# Patient Record
Sex: Female | Born: 1945 | Race: Black or African American | Hispanic: No | Marital: Married | Smoking: Never smoker
Health system: Southern US, Community
[De-identification: ages and names within clinical notes are randomized; demographics above are authoritative.]

## PROBLEM LIST (undated history)

## (undated) ENCOUNTER — Emergency Department (HOSPITAL_COMMUNITY): Disposition: A | Discharge status: Home / Self Care | Payer: Medicare Other

## (undated) DIAGNOSIS — I10 Essential (primary) hypertension: Secondary | ICD-10-CM

## (undated) DIAGNOSIS — G309 Alzheimer's disease, unspecified: Secondary | ICD-10-CM

## (undated) DIAGNOSIS — E119 Type 2 diabetes mellitus without complications: Secondary | ICD-10-CM

## (undated) DIAGNOSIS — N289 Disorder of kidney and ureter, unspecified: Secondary | ICD-10-CM

## (undated) DIAGNOSIS — F0390 Unspecified dementia without behavioral disturbance: Secondary | ICD-10-CM

## (undated) DIAGNOSIS — F0280 Dementia in other diseases classified elsewhere without behavioral disturbance: Secondary | ICD-10-CM

---

## 1998-06-07 ENCOUNTER — Other Ambulatory Visit: Admission: RE | Admit: 1998-06-07 | Discharge: 1998-06-07 | Payer: Self-pay | Admitting: Obstetrics and Gynecology

## 1998-09-17 ENCOUNTER — Other Ambulatory Visit: Admission: RE | Admit: 1998-09-17 | Discharge: 1998-09-17 | Payer: Self-pay | Admitting: Obstetrics and Gynecology

## 1999-02-11 ENCOUNTER — Other Ambulatory Visit: Admission: RE | Admit: 1999-02-11 | Discharge: 1999-02-11 | Payer: Self-pay | Admitting: Radiology

## 1999-10-25 ENCOUNTER — Encounter: Payer: Self-pay | Admitting: Specialist

## 1999-10-25 ENCOUNTER — Encounter: Admission: RE | Admit: 1999-10-25 | Discharge: 1999-10-25 | Payer: Self-pay | Admitting: Specialist

## 1999-10-31 ENCOUNTER — Other Ambulatory Visit: Admission: RE | Admit: 1999-10-31 | Discharge: 1999-10-31 | Payer: Self-pay | Admitting: Obstetrics and Gynecology

## 2000-12-08 ENCOUNTER — Other Ambulatory Visit: Admission: RE | Admit: 2000-12-08 | Discharge: 2000-12-08 | Payer: Self-pay | Admitting: Obstetrics and Gynecology

## 2002-04-19 ENCOUNTER — Other Ambulatory Visit: Admission: RE | Admit: 2002-04-19 | Discharge: 2002-04-19 | Payer: Self-pay | Admitting: Gynecology

## 2004-04-30 ENCOUNTER — Other Ambulatory Visit: Admission: RE | Admit: 2004-04-30 | Discharge: 2004-04-30 | Payer: Self-pay | Admitting: Gynecology

## 2006-06-22 ENCOUNTER — Other Ambulatory Visit: Admission: RE | Admit: 2006-06-22 | Discharge: 2006-06-22 | Payer: Self-pay | Admitting: Gynecology

## 2007-09-16 ENCOUNTER — Ambulatory Visit: Payer: Self-pay | Admitting: Internal Medicine

## 2007-10-20 ENCOUNTER — Ambulatory Visit: Payer: Self-pay | Admitting: Internal Medicine

## 2008-01-28 DIAGNOSIS — K648 Other hemorrhoids: Secondary | ICD-10-CM | POA: Insufficient documentation

## 2008-01-28 DIAGNOSIS — K921 Melena: Secondary | ICD-10-CM | POA: Insufficient documentation

## 2008-01-28 DIAGNOSIS — I10 Essential (primary) hypertension: Secondary | ICD-10-CM | POA: Insufficient documentation

## 2008-01-28 DIAGNOSIS — K644 Residual hemorrhoidal skin tags: Secondary | ICD-10-CM | POA: Insufficient documentation

## 2008-01-28 DIAGNOSIS — M1990 Unspecified osteoarthritis, unspecified site: Secondary | ICD-10-CM | POA: Insufficient documentation

## 2008-01-28 DIAGNOSIS — K5909 Other constipation: Secondary | ICD-10-CM

## 2011-04-15 NOTE — Assessment & Plan Note (Signed)
[facility]                         GASTROENTEROLOGY OFFICE NOTE   NAME:Chelsea Robinson, Chelsea Robinson                        MRN:          [id number]  DATE:09/16/2007                            DOB:          07/11/1946    REFERRING PHYSICIAN:  Horton Chin, RN, [facility]   CHIEF COMPLAINT:  Positive Hemasure test.   ASSESSMENT:  Positive Hemosure test in a woman who had a colonoscopy by  me with external hemorrhoids in February 2004.  She is otherwise  asymptomatic at this time except for chronic constipation.  She used to  be helped by stool softeners.  She seems to have forgotten them and  gotten off track with that.  However, stool softeners and/or fiber  always helped.  There are really no changes.  There is some occasional  heartburn.   RECOMMENDATIONS:  Schedule colonoscopy, further plans pending that.   See my medical history form for full details of the history.   PAST MEDICAL HISTORY:  1. External hemorrhoids with colonoscopy, 2004.  2. Hypertension.  3. Diabetes mellitus.  4. Back surgery 15 years ago.  5. Prior partial hysterectomy.   FAMILY HISTORY:  See medical history form for full details.  Pertinent  for alcoholism and liver disease in her brother.   SOCIAL HISTORY:  See medical history form for full details.  She is a  retired Geophysicist/field seismologist.  No alcohol, tobacco, or drugs.   REVIEW OF SYSTEMS:  See medical history form for full details.   PHYSICAL EXAMINATION:  GENERAL:  Alert and oriented  x3.  VITAL SIGNS:  Height 5 feet 3 inches, weight 164, pulse 80 and regular.  See medical history form for full details otherwise.   I have reviewed the office note sent by Pamelia Hoit.   I appreciate the opportunity to care for this patient.    Iva Boop, MD,FACG  Electronically Signed   CEG/MedQ  DD: 09/16/2007  DT: 09/17/2007  Job #: [id number]

## 2012-09-10 DIAGNOSIS — E119 Type 2 diabetes mellitus without complications: Secondary | ICD-10-CM

## 2015-05-02 ENCOUNTER — Encounter (HOSPITAL_COMMUNITY): Payer: Self-pay | Admitting: Emergency Medicine

## 2015-05-02 ENCOUNTER — Inpatient Hospital Stay (HOSPITAL_COMMUNITY)
Admission: EM | Admit: 2015-05-02 | Discharge: 2015-05-04 | DRG: 682 | Disposition: A | Discharge status: Home / Self Care | Payer: Medicare Other | Attending: Internal Medicine | Admitting: Internal Medicine

## 2015-05-02 ENCOUNTER — Emergency Department (HOSPITAL_COMMUNITY): Payer: Medicare Other

## 2015-05-02 DIAGNOSIS — F0390 Unspecified dementia without behavioral disturbance: Secondary | ICD-10-CM | POA: Diagnosis not present

## 2015-05-02 DIAGNOSIS — I10 Essential (primary) hypertension: Secondary | ICD-10-CM | POA: Diagnosis present

## 2015-05-02 DIAGNOSIS — E876 Hypokalemia: Secondary | ICD-10-CM | POA: Diagnosis present

## 2015-05-02 DIAGNOSIS — E860 Dehydration: Secondary | ICD-10-CM | POA: Diagnosis present

## 2015-05-02 DIAGNOSIS — N170 Acute kidney failure with tubular necrosis: Principal | ICD-10-CM | POA: Diagnosis present

## 2015-05-02 DIAGNOSIS — I959 Hypotension, unspecified: Secondary | ICD-10-CM | POA: Insufficient documentation

## 2015-05-02 DIAGNOSIS — D649 Anemia, unspecified: Secondary | ICD-10-CM | POA: Diagnosis present

## 2015-05-02 DIAGNOSIS — R197 Diarrhea, unspecified: Secondary | ICD-10-CM | POA: Diagnosis not present

## 2015-05-02 DIAGNOSIS — E119 Type 2 diabetes mellitus without complications: Secondary | ICD-10-CM | POA: Diagnosis not present

## 2015-05-02 DIAGNOSIS — N289 Disorder of kidney and ureter, unspecified: Secondary | ICD-10-CM

## 2015-05-02 DIAGNOSIS — I129 Hypertensive chronic kidney disease with stage 1 through stage 4 chronic kidney disease, or unspecified chronic kidney disease: Secondary | ICD-10-CM | POA: Diagnosis present

## 2015-05-02 DIAGNOSIS — Z79899 Other long term (current) drug therapy: Secondary | ICD-10-CM

## 2015-05-02 DIAGNOSIS — G9340 Encephalopathy, unspecified: Secondary | ICD-10-CM | POA: Diagnosis present

## 2015-05-02 DIAGNOSIS — R0902 Hypoxemia: Secondary | ICD-10-CM

## 2015-05-02 DIAGNOSIS — Z7982 Long term (current) use of aspirin: Secondary | ICD-10-CM

## 2015-05-02 DIAGNOSIS — N189 Chronic kidney disease, unspecified: Secondary | ICD-10-CM | POA: Diagnosis present

## 2015-05-02 HISTORY — DX: Essential (primary) hypertension: I10

## 2015-05-02 HISTORY — DX: Disorder of kidney and ureter, unspecified: N28.9

## 2015-05-02 HISTORY — DX: Type 2 diabetes mellitus without complications: E11.9

## 2015-05-02 HISTORY — DX: Unspecified dementia, unspecified severity, without behavioral disturbance, psychotic disturbance, mood disturbance, and anxiety: F03.90

## 2015-05-02 LAB — COMPREHENSIVE METABOLIC PANEL
ALT: 11 U/L — ABNORMAL LOW (ref 14–54)
AST: 20 U/L (ref 15–41)
Albumin: 3.8 g/dL (ref 3.5–5.0)
Alkaline Phosphatase: 44 U/L (ref 38–126)
Anion gap: 14 (ref 5–15)
BUN: 19 mg/dL (ref 6–20)
CALCIUM: 9.5 mg/dL (ref 8.9–10.3)
CHLORIDE: 108 mmol/L (ref 101–111)
CO2: 19 mmol/L — AB (ref 22–32)
Creatinine, Ser: 1.45 mg/dL — ABNORMAL HIGH (ref 0.44–1.00)
GFR calc Af Amer: 41 mL/min — ABNORMAL LOW (ref >60)
GFR, EST NON AFRICAN AMERICAN: 36 mL/min — AB (ref >60)
GLUCOSE: 168 mg/dL — AB (ref 65–99)
POTASSIUM: 3.1 mmol/L — AB (ref 3.5–5.1)
Sodium: 141 mmol/L (ref 135–145)
Total Bilirubin: 0.8 mg/dL (ref 0.3–1.2)
Total Protein: 7.1 g/dL (ref 6.5–8.1)

## 2015-05-02 LAB — CBC
HEMATOCRIT: 37.1 % (ref 36.0–46.0)
Hemoglobin: 12 g/dL (ref 12.0–15.0)
MCH: 26.8 pg (ref 26.0–34.0)
MCHC: 32.3 g/dL (ref 30.0–36.0)
MCV: 82.8 fL (ref 78.0–100.0)
Platelets: 212 10*3/uL (ref 150–400)
RBC: 4.48 MIL/uL (ref 3.87–5.11)
RDW: 15 % (ref 11.5–15.5)
WBC: 11.9 10*3/uL — AB (ref 4.0–10.5)

## 2015-05-02 LAB — GLUCOSE, CAPILLARY
Glucose-Capillary: 112 mg/dL — ABNORMAL HIGH (ref 65–99)
Glucose-Capillary: 131 mg/dL — ABNORMAL HIGH (ref 65–99)

## 2015-05-02 LAB — CLOSTRIDIUM DIFFICILE BY PCR: Toxigenic C. Difficile by PCR: NEGATIVE

## 2015-05-02 LAB — URINALYSIS, ROUTINE W REFLEX MICROSCOPIC
Bilirubin Urine: NEGATIVE
Glucose, UA: NEGATIVE mg/dL
KETONES UR: NEGATIVE mg/dL
Leukocytes, UA: NEGATIVE
Nitrite: NEGATIVE
PH: 6 (ref 5.0–8.0)
Protein, ur: NEGATIVE mg/dL
Urobilinogen, UA: 0.2 mg/dL (ref 0.0–1.0)

## 2015-05-02 LAB — TROPONIN I: Troponin I: <0.03 ng/mL (ref <0.031)

## 2015-05-02 LAB — LACTIC ACID, PLASMA: Lactic Acid, Venous: 3.7 mmol/L (ref 0.5–2.0)

## 2015-05-02 LAB — URINE MICROSCOPIC-ADD ON

## 2015-05-02 LAB — MAGNESIUM: MAGNESIUM: 1.6 mg/dL — AB (ref 1.7–2.4)

## 2015-05-02 MED ORDER — SODIUM CHLORIDE 0.9 % IV SOLN
INTRAVENOUS | Status: DC
  Administered 2015-05-02: 19:00:00 via INTRAVENOUS
  Filled 2015-05-02 (×2): qty 1000

## 2015-05-02 MED ORDER — SODIUM CHLORIDE 0.9 % IJ SOLN
3.0000 mL | Freq: Two times a day (BID) | INTRAMUSCULAR | Status: DC
  Administered 2015-05-03 – 2015-05-04 (×2): 3 mL via INTRAVENOUS

## 2015-05-02 MED ORDER — POTASSIUM CHLORIDE CRYS ER 20 MEQ PO TBCR
40.0000 meq | EXTENDED_RELEASE_TABLET | Freq: Once | ORAL | Status: AC
  Administered 2015-05-02: 40 meq via ORAL
  Filled 2015-05-02: qty 2

## 2015-05-02 MED ORDER — INSULIN ASPART 100 UNIT/ML ~~LOC~~ SOLN
0.0000 [IU] | Freq: Three times a day (TID) | SUBCUTANEOUS | Status: DC
  Administered 2015-05-03 (×2): 2 [IU] via SUBCUTANEOUS
  Administered 2015-05-04: 1 [IU] via SUBCUTANEOUS

## 2015-05-02 MED ORDER — SODIUM CHLORIDE 0.9 % IV BOLUS (SEPSIS): 1000.0000 mL | Freq: Once | INTRAVENOUS | Status: DC

## 2015-05-02 MED ORDER — HYDROCODONE-ACETAMINOPHEN 5-325 MG PO TABS: 1.0000 | ORAL_TABLET | ORAL | Status: DC | PRN

## 2015-05-02 MED ORDER — METRONIDAZOLE IN NACL 5-0.79 MG/ML-% IV SOLN
500.0000 mg | Freq: Three times a day (TID) | INTRAVENOUS | Status: DC
  Administered 2015-05-02 – 2015-05-03 (×3): 500 mg via INTRAVENOUS
  Filled 2015-05-02 (×4): qty 100

## 2015-05-02 MED ORDER — SODIUM CHLORIDE 0.9 % IV SOLN: Freq: Once | INTRAVENOUS | Status: DC

## 2015-05-02 MED ORDER — VITAMIN B-12 1000 MCG PO TABS
500.0000 ug | ORAL_TABLET | Freq: Every day | ORAL | Status: DC
  Administered 2015-05-02 – 2015-05-04 (×3): 500 ug via ORAL
  Filled 2015-05-02 (×3): qty 1

## 2015-05-02 MED ORDER — SODIUM CHLORIDE 0.9 % IV SOLN
INTRAVENOUS | Status: DC
  Administered 2015-05-02: 18:00:00 via INTRAVENOUS

## 2015-05-02 MED ORDER — ASPIRIN EC 81 MG PO TBEC
81.0000 mg | DELAYED_RELEASE_TABLET | Freq: Every day | ORAL | Status: DC
  Administered 2015-05-02 – 2015-05-04 (×3): 81 mg via ORAL
  Filled 2015-05-02 (×3): qty 1

## 2015-05-02 MED ORDER — MEMANTINE HCL ER 28 MG PO CP24
28.0000 mg | ORAL_CAPSULE | Freq: Every day | ORAL | Status: DC
  Administered 2015-05-02 – 2015-05-04 (×3): 28 mg via ORAL
  Filled 2015-05-02 (×6): qty 1

## 2015-05-02 MED ORDER — ONDANSETRON HCL 4 MG PO TABS: 4.0000 mg | ORAL_TABLET | Freq: Four times a day (QID) | ORAL | Status: DC | PRN

## 2015-05-02 MED ORDER — ONDANSETRON HCL 4 MG/2ML IJ SOLN: 4.0000 mg | Freq: Four times a day (QID) | INTRAMUSCULAR | Status: DC | PRN

## 2015-05-02 MED ORDER — HEPARIN SODIUM (PORCINE) 5000 UNIT/ML IJ SOLN
5000.0000 [IU] | Freq: Three times a day (TID) | INTRAMUSCULAR | Status: DC
  Administered 2015-05-02 – 2015-05-04 (×6): 5000 [IU] via SUBCUTANEOUS
  Filled 2015-05-02 (×6): qty 1

## 2015-05-02 MED ORDER — INSULIN ASPART 100 UNIT/ML ~~LOC~~ SOLN: 0.0000 [IU] | Freq: Every day | SUBCUTANEOUS | Status: DC

## 2015-05-02 MED ORDER — ACETAMINOPHEN 325 MG PO TABS: 650.0000 mg | ORAL_TABLET | Freq: Four times a day (QID) | ORAL | Status: DC | PRN

## 2015-05-02 MED ORDER — ALUM & MAG HYDROXIDE-SIMETH 200-200-20 MG/5ML PO SUSP: 30.0000 mL | Freq: Four times a day (QID) | ORAL | Status: DC | PRN

## 2015-05-02 MED ORDER — ACETAMINOPHEN 650 MG RE SUPP: 650.0000 mg | Freq: Four times a day (QID) | RECTAL | Status: DC | PRN

## 2015-05-02 MED ORDER — SODIUM CHLORIDE 0.9 % IV BOLUS (SEPSIS): 500.0000 mL | Freq: Once | INTRAVENOUS | Status: DC

## 2015-05-02 NOTE — ED Notes (Signed)
Emptied large amount of watery stool from bedpan.

## 2015-05-02 NOTE — ED Notes (Addendum)
Pt granddaughter, [phone], confirms that pt has had increased confusion and not staying hydrated for last week. Pt granddaughter reports that pt is being seen by kidney specialist for "unknown severity kidney problems."

## 2015-05-02 NOTE — Progress Notes (Signed)
Patient with critical lab lactic acid 3.7 Dr. Kerry HoughMemon notified.

## 2015-05-02 NOTE — H&P (Signed)
[facility] History and Physical  Chelsea Robinson Cecilio ZOX:[id number] DOB: [birth year] DOA: 05/02/2015  Referring physician: Rolland Porter PCP: Delorse Lek, MD   Chief Complaint: ams  HPI: Chelsea Robinson is a 69 y.o. female with a past medical history that includes diabetes, dementia, hypertension, renal disorder presents to the emergency department with the chief complaint of altered mental status. Initial evaluation in the emergency department reveals hypotension, leukocytosis of 11.9 hypokalemia and renal failure. In addition patient had one very large watery stool.  Information obtained from the family who report gradual increased confusion over the last several days but no fever chills nausea vomiting or diarrhea. No reported cough dizziness syncope or near-syncope. No recent falls. Granddaughter does report patient in the process of outpatient workup for "kidney trouble". Agents baseline is not very verbal unable to make wants and needs known incontinent of bladder and bowel which is fairly recent development.  Workup in the emergency department includes chest x-ray and urinalysis that are unremarkable. EKG sinus rhythm, initial troponin negative, lactic acid pending. C. difficile negative. Ova and parasite and GI pathogen PCR pending. His given 1 L of normal saline at the time of my exam her blood pressure is 146/80 she is alert and at her baseline mentality.    Review of Systems:  10 point review of systems completed with family members as patient unable to provide information. Past Medical History  Diagnosis Date  . Diabetes mellitus without complication   . Dementia   . Hypertension   . Renal disorder    History reviewed. No pertinent past surgical history. Social History:  reports that she does not drink alcohol or use illicit drugs. Her tobacco history is not on file. She lives at home with her son and granddaughter she requires 24 7 care she is ambulatory Not on  File  History reviewed. No pertinent family history. family medical history reviewed and noncontributory to the admission of this elderly lady  Prior to Admission medications   Medication Sig Start Date End Date Taking? Authorizing Provider  aspirin 81 MG tablet Take 81 mg by mouth daily.   Yes Historical Provider, MD  cyanocobalamin 500 MCG tablet Take 500 mcg by mouth daily.   Yes Historical Provider, MD  lisinopril-hydrochlorothiazide (PRINZIDE,ZESTORETIC) 20-12.5 MG per tablet Take 1 tablet by mouth daily.  02/23/15  Yes Historical Provider, MD  LORazepam (ATIVAN) 0.5 MG tablet Take 0.5 mg by mouth at bedtime.  04/16/15  Yes Historical Provider, MD  metFORMIN (GLUCOPHAGE) 500 MG tablet Take 500 mg by mouth 2 (two) times daily with a meal.   Yes Historical Provider, MD  NAMENDA XR 28 MG CP24 24 hr capsule Take 28 mg by mouth daily.  04/07/15  Yes Historical Provider, MD   Physical Exam: Filed Vitals:   05/02/15 1445 05/02/15 1450 05/02/15 1500 05/02/15 1515  BP: 152/63 152/63 134/85 143/74  Pulse: 59 70  63  Temp:      TempSrc:      Resp: Height:      Weight:      SpO2: 100% 100%  100%    Wt Readings from Last 3 Encounters:  05/02/15 45.36 kg (100 lb)    General:  Appears calm and comfortable Eyes: PERRL, normal lids, irises & conjunctiva ENT: grossly normal hearing, lips & tongue, weakness membranes of her mouth are slightly dry Neck: no LAD, masses or thyromegaly Cardiovascular: RRR, no Robinson/r/g. No LE edema.  Respiratory: CTA bilaterally, no  w/r/r. Normal respiratory effort. Abdomen: soft, ntnd positive bowel sounds no guarding Skin: no rash or induration seen on limited exam Musculoskeletal: grossly normal tone BUE/BLE Psychiatric: grossly normal mood and affect, speech fluent and appropriate Neurologic: Alert oriented to self only . speech slow attempts to follow commands           Labs on Admission:  Basic Metabolic Panel:  Recent Labs Lab 05/02/15 1140   NA 141  K 3.1*  CL 108  CO2 19*  GLUCOSE 168*  BUN 19  CREATININE 1.45*  CALCIUM 9.5   Liver Function Tests:  Recent Labs Lab 05/02/15 1140  AST 20  ALT 11*  ALKPHOS 44  BILITOT 0.8  PROT 7.1  ALBUMIN 3.8   No results for input(s): LIPASE, AMYLASE in the last 168 hours. No results for input(s): AMMONIA in the last 168 hours. CBC:  Recent Labs Lab 05/02/15 1159  WBC 11.9*  HGB 12.0  HCT 37.1  MCV 82.8  PLT 212   Cardiac Enzymes:  Recent Labs Lab 05/02/15 1140  TROPONINI <0.03    BNP (last 3 results) No results for input(s): BNP in the last 8760 hours.  ProBNP (last 3 results) No results for input(s): PROBNP in the last 8760 hours.  CBG: No results for input(s): GLUCAP in the last 168 hours.  Radiological Exams on Admission: Dg Chest 1 View  05/02/2015   CLINICAL DATA:  Hypoxia. Hypotension. Increase confusion for 1 week.  EXAM: CHEST  1 VIEW  COMPARISON:  04/07/2015  FINDINGS: The heart size and mediastinal contours are within normal limits. Both lungs are clear. Previously described left lower lobe opacity is no longer visualized on this exam. No evidence of pleural effusion or pneumothorax. The visualized skeletal structures are unremarkable.  IMPRESSION: No active disease.   Electronically Signed   By: Myles Rosenthal Robinson.D.   On: 05/02/2015 13:04    EKG: Independently reviewed sinus rhythm  Assessment/Plan Principal Problem:   Acute encephalopathy: Much improved at the time of admission. Likely related to hypotension in the setting of diarrhea and decreased by mouth intake. Much improved after 1 L of normal saline intravenously. Will admit. Will continue gentle IV hydration. No indication of an infectious process. She does have a mild leukocytosis that is probably reactive. Active Problems:    Essential hypertension: Patient hypotensive upon presentation likely related to volume in the setting of diarrhea and decreased by mouth intake. Blood pressure must  improved after IV fluids given. Home medications include lisinopril, hydrochlorothiazide, will hold these. Will monitor blood pressure and resume as indicated.  Acute kidney failure.: Family reports patient under the care of a nephrologist recently due to "kidney issues". Unable to obtain records so her baseline creatinine is unknown. I will request records. In the meantime I will hold any nephrotoxic is gently hydrate with IV fluids as noted above monitor urine output recheck in the morning    Diarrhea: Etiology uncertain. C. difficile negative. Await stool studies. Of note family member does report seeing a wormlike and/or noodle like substance humming out of patient's rectum in the recent past. He is afebrile and nontoxic appearing. Will start Flagyl empirically. Clear liquid diet    Diabetes mellitus without complication: On oral agents. In the hold this for now as her appetite unreliable. Will obtain a hemoglobin A1c. Use sliding scale insulin for optimal control    Dementia: See #1. Patient reports current mentation close to baseline   Code Status: full DVT Prophylaxis: Family Communication:  son at bedside Disposition Plan: home when ready hopefully tomorrow  Time spent: 60 minutes  [facility] Surgical Suites LLCBLACK,Chelsea Robinson [facility] Pager [phone]

## 2015-05-02 NOTE — ED Notes (Signed)
Report given to floor nurse, all questions answered  

## 2015-05-02 NOTE — ED Notes (Signed)
Patient has received a total of 1 Liter NS

## 2015-05-02 NOTE — ED Provider Notes (Signed)
CSN: [id number]     Arrival date & time 05/02/15  0926 History  This chart was scribed for Rolland PorterMark Marla Pouliot, MD by Tanda RockersMargaux Venter, ED Scribe. This patient was seen in room APA03/APA03 and the patient's care was started at 9:59 AM.    Chief Complaint  Patient presents with  . Failure To Thrive   LEVEL 5 CAVEAT  The history is provided by the patient. No language interpreter was used.   HPI Comments: Zelphia CairoMary R Smouse is a 69 y.o. female brought in by ambulance, with hx DM, Dementia, HTN, Renal disorder, who presents to the Emergency Department for failure to thrive. Pt is unsure if she is feeling sick. She states she thinks she has been coughing. Pt admits to feeling confused. Denies pain at this moment.  No family is with pt to give a better history.   Past Medical History  Diagnosis Date  . Diabetes mellitus without complication   . Dementia   . Hypertension   . Renal disorder    History reviewed. No pertinent past surgical history. History reviewed. No pertinent family history. History  Substance Use Topics  . Smoking status: Unknown If Ever Smoked  . Smokeless tobacco: Not on file  . Alcohol Use: No   OB History    No data available     Review of Systems  Unable to perform ROS: Mental status change      Allergies  Review of patient's allergies indicates not on file.  Home Medications   Prior to Admission medications   Medication Sig Start Date End Date Taking? Authorizing Provider  aspirin 81 MG tablet Take 81 mg by mouth daily.   Yes Historical Provider, MD  cyanocobalamin 500 MCG tablet Take 500 mcg by mouth daily.   Yes Historical Provider, MD  lisinopril-hydrochlorothiazide (PRINZIDE,ZESTORETIC) 20-12.5 MG per tablet Take 1 tablet by mouth daily.  02/23/15  Yes Historical Provider, MD  LORazepam (ATIVAN) 0.5 MG tablet Take 0.5 mg by mouth at bedtime.  04/16/15  Yes Historical Provider, MD  metFORMIN (GLUCOPHAGE) 500 MG tablet Take 500 mg by mouth 2 (two) times daily with a  meal.   Yes Historical Provider, MD  NAMENDA XR 28 MG CP24 24 hr capsule Take 28 mg by mouth daily.  04/07/15  Yes Historical Provider, MD   Triage Vitals: BP 92/49 mmHg  Pulse 69  Temp(Src) 97.5 F (36.4 C) (Oral)  Resp 16  Ht 5\' 9"  (1.753 m)  Wt 100 lb (45.36 kg)  BMI 14.76 kg/m2  SpO2 97%  Physical Exam  Constitutional: She is oriented to person, place, and time. She appears well-developed and well-nourished. No distress.  HENT:  Head: Normocephalic.  Mouth/Throat: Mucous membranes are normal.  Eyes: Conjunctivae are normal. Pupils are equal, round, and reactive to light. No scleral icterus.  Neck: Normal range of motion. Neck supple. No thyromegaly present.  Cardiovascular: Normal rate and regular rhythm.  Exam reveals no gallop and no friction rub.   No murmur heard. Pulmonary/Chest: Effort normal. No respiratory distress. She has no wheezes. She has no rales.  Diminished breath sounds.   Abdominal: Soft. Bowel sounds are normal. She exhibits no distension. There is no tenderness. There is no rebound.  Musculoskeletal: Normal range of motion.  Neurological: She is alert and oriented to person, place, and time.  Skin: Skin is warm and dry. No rash noted.  Psychiatric: She has a normal mood and affect. Her behavior is normal.    ED Course  Procedures (including  critical care time)  DIAGNOSTIC STUDIES: Oxygen Saturation is 97% on RA, normal by my interpretation.    COORDINATION OF CARE: 10:02 AM-Discussed treatment plan which includes CBC, BMP with pt at bedside and pt agreed to plan.   Labs Review Labs Reviewed  CBC - Abnormal; Notable for the following:    WBC 11.9 (*)    All other components within normal limits  COMPREHENSIVE METABOLIC PANEL - Abnormal; Notable for the following:    Potassium 3.1 (*)    CO2 19 (*)    Glucose, Bld 168 (*)    Creatinine, Ser 1.45 (*)    ALT 11 (*)    GFR calc non Af Amer 36 (*)    GFR calc Af Amer 41 (*)    All other components  within normal limits  URINALYSIS, ROUTINE W REFLEX MICROSCOPIC (NOT AT [facility]) - Abnormal; Notable for the following:    Specific Gravity, Urine <1.005 (*)    Hgb urine dipstick TRACE (*)    All other components within normal limits  CULTURE, BLOOD (ROUTINE X 2)  CULTURE, BLOOD (ROUTINE X 2)  URINE CULTURE  OVA AND PARASITE EXAMINATION  CLOSTRIDIUM DIFFICILE BY PCR (NOT AT [facility])  TROPONIN I  URINE MICROSCOPIC-ADD ON  LACTIC ACID, PLASMA  LACTIC ACID, PLASMA    Imaging Review Dg Chest 1 View  05/02/2015   CLINICAL DATA:  Hypoxia. Hypotension. Increase confusion for 1 week.  EXAM: CHEST  1 VIEW  COMPARISON:  04/07/2015  FINDINGS: The heart size and mediastinal contours are within normal limits. Both lungs are clear. Previously described left lower lobe opacity is no longer visualized on this exam. No evidence of pleural effusion or pneumothorax. The visualized skeletal structures are unremarkable.  IMPRESSION: No active disease.   Electronically Signed   By: Myles Rosenthal M.D.   On: 05/02/2015 13:04     EKG Interpretation None      MDM   Final diagnoses:  Hypoxia  Hypotension, unspecified hypotension type  Diarrhea    Patient's blood pressure improved after IV fluids. Stool obtained for O&P, as well as C. difficile. Slight elevation of her creatinine. Plan is admission. Discussed with hospitalist.   I personally performed the services described in this documentation, which was scribed in my presence. The recorded information has been reviewed and is accurate.      Rolland Porter, MD 05/02/15 1452

## 2015-05-02 NOTE — ED Notes (Signed)
Per [facility], pt has poor oral intake and increased confusion x1 week. Per EMS, pt CBG 163. Per EMS, hypotension and 88 o2 saturation on room air. Pt received 350 cc of NS en route. BP 85/52. Pt o2 saturation 90% on non-rebreather.pt alert.

## 2015-05-02 NOTE — ED Notes (Signed)
After numerous attempts lab unable to obtain blood for lactic acid

## 2015-05-03 DIAGNOSIS — D649 Anemia, unspecified: Secondary | ICD-10-CM | POA: Diagnosis present

## 2015-05-03 DIAGNOSIS — N170 Acute kidney failure with tubular necrosis: Secondary | ICD-10-CM | POA: Diagnosis present

## 2015-05-03 DIAGNOSIS — Z79899 Other long term (current) drug therapy: Secondary | ICD-10-CM | POA: Diagnosis not present

## 2015-05-03 DIAGNOSIS — R197 Diarrhea, unspecified: Secondary | ICD-10-CM | POA: Diagnosis not present

## 2015-05-03 DIAGNOSIS — E860 Dehydration: Secondary | ICD-10-CM | POA: Diagnosis present

## 2015-05-03 DIAGNOSIS — F0390 Unspecified dementia without behavioral disturbance: Secondary | ICD-10-CM | POA: Diagnosis not present

## 2015-05-03 DIAGNOSIS — E876 Hypokalemia: Secondary | ICD-10-CM | POA: Diagnosis present

## 2015-05-03 DIAGNOSIS — I959 Hypotension, unspecified: Secondary | ICD-10-CM | POA: Diagnosis not present

## 2015-05-03 DIAGNOSIS — I10 Essential (primary) hypertension: Secondary | ICD-10-CM

## 2015-05-03 DIAGNOSIS — E119 Type 2 diabetes mellitus without complications: Secondary | ICD-10-CM | POA: Diagnosis not present

## 2015-05-03 DIAGNOSIS — N189 Chronic kidney disease, unspecified: Secondary | ICD-10-CM | POA: Diagnosis present

## 2015-05-03 DIAGNOSIS — Z7982 Long term (current) use of aspirin: Secondary | ICD-10-CM | POA: Diagnosis not present

## 2015-05-03 DIAGNOSIS — I129 Hypertensive chronic kidney disease with stage 1 through stage 4 chronic kidney disease, or unspecified chronic kidney disease: Secondary | ICD-10-CM | POA: Diagnosis present

## 2015-05-03 DIAGNOSIS — G9340 Encephalopathy, unspecified: Secondary | ICD-10-CM | POA: Diagnosis not present

## 2015-05-03 LAB — OVA AND PARASITE EXAMINATION: Ova and parasites: NONE SEEN

## 2015-05-03 LAB — BASIC METABOLIC PANEL
Anion gap: 8 (ref 5–15)
BUN: 21 mg/dL — AB (ref 6–20)
CALCIUM: 8.8 mg/dL — AB (ref 8.9–10.3)
CHLORIDE: 114 mmol/L — AB (ref 101–111)
CO2: 20 mmol/L — AB (ref 22–32)
Creatinine, Ser: 1.58 mg/dL — ABNORMAL HIGH (ref 0.44–1.00)
GFR calc non Af Amer: 32 mL/min — ABNORMAL LOW (ref >60)
GFR, EST AFRICAN AMERICAN: 37 mL/min — AB (ref >60)
Glucose, Bld: 106 mg/dL — ABNORMAL HIGH (ref 65–99)
Potassium: 4.1 mmol/L (ref 3.5–5.1)
Sodium: 142 mmol/L (ref 135–145)

## 2015-05-03 LAB — URINE CULTURE
CULTURE: NO GROWTH
Colony Count: NO GROWTH

## 2015-05-03 LAB — CBC
HEMATOCRIT: 27.5 % — AB (ref 36.0–46.0)
Hemoglobin: 9.2 g/dL — ABNORMAL LOW (ref 12.0–15.0)
MCH: 27.4 pg (ref 26.0–34.0)
MCHC: 33.5 g/dL (ref 30.0–36.0)
MCV: 81.8 fL (ref 78.0–100.0)
Platelets: 248 10*3/uL (ref 150–400)
RBC: 3.36 MIL/uL — ABNORMAL LOW (ref 3.87–5.11)
RDW: 15 % (ref 11.5–15.5)
WBC: 9.9 10*3/uL (ref 4.0–10.5)

## 2015-05-03 LAB — MAGNESIUM: Magnesium: 1.6 mg/dL — ABNORMAL LOW (ref 1.7–2.4)

## 2015-05-03 LAB — LACTIC ACID, PLASMA: LACTIC ACID, VENOUS: 1.9 mmol/L (ref 0.5–2.0)

## 2015-05-03 LAB — HEMOGLOBIN A1C
Hgb A1c MFr Bld: 8 % — ABNORMAL HIGH (ref 4.8–5.6)
MEAN PLASMA GLUCOSE: 183 mg/dL

## 2015-05-03 LAB — GLUCOSE, CAPILLARY
GLUCOSE-CAPILLARY: 90 mg/dL (ref 65–99)
Glucose-Capillary: 101 mg/dL — ABNORMAL HIGH (ref 65–99)
Glucose-Capillary: 158 mg/dL — ABNORMAL HIGH (ref 65–99)
Glucose-Capillary: 156 mg/dL — ABNORMAL HIGH (ref 65–99)

## 2015-05-03 LAB — TROPONIN I

## 2015-05-03 MED ORDER — SODIUM BICARBONATE 8.4 % IV SOLN
INTRAVENOUS | Status: DC
  Filled 2015-05-03 (×3): qty 50

## 2015-05-03 MED ORDER — LORAZEPAM 1 MG PO TABS
1.0000 mg | ORAL_TABLET | Freq: Once | ORAL | Status: AC
  Administered 2015-05-03: 1 mg via ORAL
  Filled 2015-05-03: qty 1

## 2015-05-03 MED ORDER — MAGNESIUM SULFATE 2 GM/50ML IV SOLN
2.0000 g | INTRAVENOUS | Status: AC
  Administered 2015-05-03 (×2): 2 g via INTRAVENOUS
  Filled 2015-05-03: qty 50

## 2015-05-03 MED ORDER — MAGNESIUM SULFATE 4 GM/100ML IV SOLN: 4.0000 g | Freq: Once | INTRAVENOUS | Status: DC

## 2015-05-03 MED ORDER — SODIUM BICARBONATE 8.4 % IV SOLN
INTRAVENOUS | Status: DC
  Filled 2015-05-03 (×3): qty 150

## 2015-05-03 MED ORDER — POTASSIUM CHLORIDE IN NACL 40-0.9 MEQ/L-% IV SOLN: INTRAVENOUS | Status: DC

## 2015-05-03 MED ORDER — DEXTROSE-NACL 5-0.45 % IV SOLN: INTRAVENOUS | Status: DC

## 2015-05-03 MED ORDER — DEXTROSE-NACL 5-0.45 % IV SOLN
INTRAVENOUS | Status: DC
  Administered 2015-05-03: 12:00:00 via INTRAVENOUS

## 2015-05-03 NOTE — Care Management Note (Signed)
Case Management Note  Patient Details  Name: Chelsea Robinson MRN: [id number] Date of Birth: 08/18/1946   Expected Discharge Date:  05/05/15               Expected Discharge Plan:  Home/Self Care  In-House Referral:  NA  Discharge planning Services  CM Consult  Post Acute Care Choice:  NA Choice offered to:  NA  DME Arranged:    DME Agency:     HH Arranged:    HH Agency:     Status of Service:  Completed, signed off  Medicare Important Message Given:    Date Medicare IM Given:    Medicare IM give by:    Date Additional Medicare IM Given:    Additional Medicare Important Message give by:     If discussed at Long Length of Stay Meetings, dates discussed:    Additional Comments: Pt is from home and is independent at baseline. Pt has advanced dementia, her son lives with her and cares for her at night. 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 Pt has no DME's or HH services prior to admission. Pt's son does not anticipate she will need them. Pt plans to discharge home with self care/24-7 supervision. Will cont to follow but no CM need anticipated.   Malcolm Metrohildress, Mariem Skolnick Demske, RN 05/03/2015, 2:24 PM

## 2015-05-03 NOTE — Evaluation (Deleted)
Physical Therapy Evaluation Patient Details Name: Chelsea CairoMary R Topper MRN: [id number] DOB: 12/27/1945 Today's Date: 05/03/2015   History of Present Illness  Patient was brought to the hospital with worsening of her mental status. She was found to be dehydrated, hypotensive and having frequent diarrhea. She received IV fluids with improvement of her blood pressure and mental status. She does have some renal insufficiency will be given further IV fluids. Stool studies have been sent. Continue to monitor clinically for now.  Clinical Impression   Pt was seen for a partial evaluation.  She presents with significant dementia, not even able to give me her name.  She has 3/5 strength in LEs.  A rectal tube is currently in place and RN came in to place a foley catheter so we suspended evaluation.  Will try to continue later on today.    Follow Up Recommendations  (to be determined)    Equipment Recommendations  None recommended by PT    Recommendations for Other Services   none    Precautions / Restrictions Precautions Precaution Comments: c diff Restrictions Weight Bearing Restrictions: No      Mobility  Bed Mobility               General bed mobility comments: unable to evaluate as RN needed to place a foley catheter.  Transfers                    Ambulation/Gait                Stairs            Wheelchair Mobility    Modified Rankin (Stroke Patients Only)       Balance                                             Pertinent Vitals/Pain Pain Assessment: No/denies pain    Home Living Family/patient expects to be discharged to:: Private residence Living Arrangements: Children Available Help at Discharge: Family             Additional Comments: pt has significant dementia and is unable to provide any history    Prior Function           Comments: unknown     Hand Dominance        Extremity/Trunk Assessment               Lower Extremity Assessment: Generalized weakness (generally 3/5 strength in LEs)         Communication   Communication: No difficulties  Cognition Arousal/Alertness: Lethargic Behavior During Therapy: Flat affect Overall Cognitive Status: History of cognitive impairments - at baseline                      General Comments      Exercises        Assessment/Plan    PT Assessment  (to be determined)  PT Diagnosis  (to be determined)   PT Problem List    PT Treatment Interventions     PT Goals (Current goals can be found in the Care Plan section)      Frequency     Barriers to discharge        Co-evaluation               End of Session   Activity  Tolerance: Patient tolerated treatment well Patient left: in bed;with bed alarm set Nurse Communication: Mobility status    Functional Assessment Tool Used: clinical judgement Functional Limitation:  (to be determined)    Time: 1610-9604 PT Time Calculation (min) (ACUTE ONLY): 18 min   Charges:   PT Evaluation $Initial PT Evaluation Tier I: 1 Procedure     PT G Codes:   PT G-Codes **NOT FOR INPATIENT CLASS** Functional Assessment Tool Used: clinical judgement Functional Limitation:  (to be determined)    Myrlene Broker L 05/03/2015, 11:26 AM

## 2015-05-03 NOTE — Progress Notes (Signed)
[facility] PROGRESS NOTE  Chelsea Robinson ZOX:[id number] DOB: 01-Oct-1946 DOA: 05/02/2015 PCP: Delorse Lek, MD  Assessment/Plan: 1. Acute encephalopathy superimposed on dementia. Likely related to hypotension. Improved with IV fluid administration. Pierce to be back to baseline. 2. Acute kidney injury, may be an element of chronic kidney disease. We'll request labs from her primary care physician. Creatinine has trended higher today. There may be an element of ATN related to hypotension experienced yesterday. 3. Diarrhea. None since admission. Stool C. difficile as well as ova and parasites have been negative. We'll discontinue Flagyl. Advance diet. 4. Diabetes. Continue sliding scale insulin. 5. Anemia. Possibly chronic. No evidence of bleeding. We'll request labs from primary care physician  Code Status: full code Family Communication: discussed with patient and family at the bedside Disposition Plan: discharge home once improved   Consultants:    Procedures:    Antibiotics:  Flagyl 6/1>>6/2  HPI/Subjective: Patient is confused, does not offer any complaints, per family, no diarrhea or vomiting  Objective: Filed Vitals:   05/03/15 1505  BP: 114/64  Pulse: 73  Temp: 97.6 F (36.4 C)  Resp: 16    Intake/Output Summary (Last 24 hours) at 05/03/15 1718 Last data filed at 05/03/15 1507  Gross per 24 hour  Intake   1425 ml  Output    800 ml  Net    625 ml   Filed Weights   05/02/15 0932 05/02/15 1543  Weight: 45.36 kg (100 lb) 52.2 kg (115 lb 1.3 oz)    Exam:   General:  NAD, confused  Cardiovascular: s1, s2, rrr  Respiratory: cta b  Abdomen: soft, nt, nd, bs+  Musculoskeletal: no edema b/l   Data Reviewed: Basic Metabolic Panel:  Recent Labs Lab 05/02/15 1140 05/02/15 1457 05/03/15 0530  NA 141  --  142  K 3.1*  --  4.1  CL 108  --  114*  CO2 19*  --  20*  GLUCOSE 168*  --  106*  BUN 19  --  21*  CREATININE 1.45*  --  1.58*  CALCIUM  9.5  --  8.8*  MG  --  1.6* 1.6*   Liver Function Tests:  Recent Labs Lab 05/02/15 1140  AST 20  ALT 11*  ALKPHOS 44  BILITOT 0.8  PROT 7.1  ALBUMIN 3.8   No results for input(s): LIPASE, AMYLASE in the last 168 hours. No results for input(s): AMMONIA in the last 168 hours. CBC:  Recent Labs Lab 05/02/15 1159 05/03/15 0530  WBC 11.9* 9.9  HGB 12.0 9.2*  HCT 37.1 27.5*  MCV 82.8 81.8  PLT 212 248   Cardiac Enzymes:  Recent Labs Lab 05/02/15 1140 05/02/15 2002 05/03/15 0530  TROPONINI <0.03 <0.03 <0.03   BNP (last 3 results) No results for input(s): BNP in the last 8760 hours.  ProBNP (last 3 results) No results for input(s): PROBNP in the last 8760 hours.  CBG:  Recent Labs Lab 05/02/15 1616 05/02/15 2132 05/03/15 0726 05/03/15 1151 05/03/15 1627  GLUCAP 112* 131* 101* 158* 156*    Recent Results (from the past 240 hour(s))  Ova and parasite examination     Status: None   Collection Time: 05/02/15 11:00 AM  Result Value Ref Range Status   Specimen Description STOOL  Final   Special Requests NONE  Final   Ova and parasites   Final    NO OVA OR PARASITES SEEN Performed at [facility]    Report Status 05/03/2015 FINAL  Final  Clostridium Difficile by PCR     Status: None   Collection Time: 05/02/15 11:00 AM  Result Value Ref Range Status   C difficile by pcr NEGATIVE NEGATIVE Final  Culture, blood (routine x 2)     Status: None (Preliminary result)   Collection Time: 05/02/15 11:10 AM  Result Value Ref Range Status   Specimen Description BLOOD LEFT HAND  Final   Special Requests BOTTLES DRAWN AEROBIC ONLY 4CC  Final   Culture NO GROWTH 1 DAY  Final   Report Status PENDING  Incomplete  Culture, blood (routine x 2)     Status: None (Preliminary result)   Collection Time: 05/02/15 11:59 AM  Result Value Ref Range Status   Specimen Description BLOOD RIGHT HAND  Final   Special Requests BOTTLES DRAWN AEROBIC ONLY 4CC  Final   Culture  NO GROWTH 1 DAY  Final   Report Status PENDING  Incomplete     Studies: Dg Chest 1 View  05/02/2015   CLINICAL DATA:  Hypoxia. Hypotension. Increase confusion for 1 week.  EXAM: CHEST  1 VIEW  COMPARISON:  04/07/2015  FINDINGS: The heart size and mediastinal contours are within normal limits. Both lungs are clear. Previously described left lower lobe opacity is no longer visualized on this exam. No evidence of pleural effusion or pneumothorax. The visualized skeletal structures are unremarkable.  IMPRESSION: No active disease.   Electronically Signed   By: Myles RosenthalJohn  Stahl M.D.   On: 05/02/2015 13:04    Scheduled Meds: . aspirin EC  81 mg Oral Daily  . heparin  5,000 Units Subcutaneous 3 times per day  . insulin aspart  0-5 Units Subcutaneous QHS  . insulin aspart  0-9 Units Subcutaneous TID WC  . memantine  28 mg Oral Daily  . sodium chloride  3 mL Intravenous Q12H  . cyanocobalamin  500 mcg Oral Daily   Continuous Infusions: . dextrose 5 % and 0.45% NaCl 100 mL/hr at 05/03/15 1206    Principal Problem:   Acute encephalopathy Active Problems:   Essential hypertension   Diarrhea   Diabetes mellitus without complication   Dementia   Renal disorder   Hypokalemia    Time spent: 30mins    MEMON,JEHANZEB  [facility] Pager [phone]. If 7PM-7AM, please contact night-coverage at [email or web address], password [email or web address] 05/03/2015, 5:18 PM  LOS: 1 day

## 2015-05-03 NOTE — Evaluation (Addendum)
Physical Therapy Evaluation Patient Details Name: Reni Hausner Garcon MRN: [id number] DOB: 1946-05-29 Today's Date: 05/03/2015   History of Present Illness  Patient was brought to the hospital with worsening of her mental status. She was found to be dehydrated, hypotensive and having frequent diarrhea. She received IV fluids with improvement of her blood pressure and mental status. She does have some renal insufficiency will be given further IV fluids. Stool studies have been sent. Continue to monitor clinically for now.  Clinical Impression  Pt was seen for evaluation which was interrupted for a brief time while RN placed the foley catheter.  She was found to have a severe cognitive deficit due to dementia but fortunately her sister arrived and indicated that pt was able to ambulate independently at home.  Pt lives alone but family rotates in and out to supervise her.  Her strength and balance are WNL and pt is able to ambulate with no assistive device for functional distance.  I do not anticipate that she will have any difficulty returning home at d/c.    Follow Up Recommendations No PT follow up    Equipment Recommendations  None recommended by PT    Recommendations for Other Services   none    Precautions / Restrictions Precautions Precaution Comments: c diff Restrictions Weight Bearing Restrictions: No      Mobility  Bed Mobility Overal bed mobility: Modified Independent             General bed mobility comments: unable to evaluate as RN needed to place a foley catheter.  Transfers Overall transfer level: Modified independent                  Ambulation/Gait Ambulation/Gait assistance: Supervision Ambulation Distance (Feet): 50 Feet Assistive device: None Gait Pattern/deviations: WFL(Within Functional Limits)   Gait velocity interpretation: at or above normal speed for age/gender                      Balance Overall balance assessment: No apparent  balance deficits (not formally assessed)                                           Pertinent Vitals/Pain Pain Assessment: No/denies pain    Home Living Family/patient expects to be discharged to:: Private residence Living Arrangements: Alone Available Help at Discharge: Available 24 hours/day Type of Home: House       Home Layout: One level   Additional Comments: pt has significant dementia and is unable to provide any history    Prior Function Level of Independence: Needs assistance   Gait / Transfers Assistance Needed: ambulates and transfers independently  ADL's / Homemaking Assistance Needed: assist with all ADLs due to cognitive impairment  Comments: unknown             Extremity/Trunk Assessment   Upper Extremity Assessment: Overall WFL for tasks assessed           Lower Extremity Assessment: Overall WFL for tasks assessed         Communication   Communication: No difficulties  Cognition Arousal/Alertness: Awake/alert Behavior During Therapy: Flat affect Overall Cognitive Status: History of cognitive impairments - at baseline  Assessment/Plan    PT Assessment Patent does not need any further PT services  PT Diagnosis  (to be determined)   PT Problem List    PT Treatment Interventions     PT Goals (Current goals can be found in the Care Plan section) Acute Rehab PT Goals PT Goal Formulation: All assessment and education complete, DC therapy         Barriers to discharge    none                   End of Session Equipment Utilized During Treatment: Gait belt Activity Tolerance: Patient tolerated treatment well Patient left: in chair;with call bell/phone within reach;with chair alarm set Nurse Communication: Mobility status    Functional Assessment Tool Used: clinical judgement Functional Limitation: Mobility: Walking and moving around Mobility: Walking and  Moving Around Current Status [phone](G8978): 0 percent impaired, limited or restricted Mobility: Walking and Moving Around Goal Status [phone](G8979): 0 percent impaired, limited or restricted Mobility: Walking and Moving Around Discharge Status [phone](G8980): 0 percent impaired, limited or restricted    Time: 1047 (returned at 11:38 and left at 11:58 to complete the eval)-1105 PT Time Calculation (min) (ACUTE ONLY): 18 min   Charges:   PT Evaluation $Initial PT Evaluation Tier I: 1 Procedure     PT G Codes:   PT G-Codes **NOT FOR INPATIENT CLASS** Functional Assessment Tool Used: clinical judgement Functional Limitation: Mobility: Walking and moving around Mobility: Walking and Moving Around Current Status ([phone](G8978): 0 percent impaired, limited or restricted Mobility: Walking and Moving Around Goal Status ([id number](G8979): 0 percent impaired, limited or restricted Mobility: Walking and Moving Around Discharge Status ([phone](G8980): 0 percent impaired, limited or restricted    Myrlene BrokerBrown, Shantee Hayne L  PT 05/03/2015, 12:40 PM

## 2015-05-03 NOTE — Progress Notes (Signed)
Patient pulled IV out. Attempted x2 to restick with the help of her son and other staff. Patient becomes combative and will not allow IV to be accessed. Dr. Kerry HoughMemon notified.

## 2015-05-03 NOTE — Progress Notes (Signed)
New order for Ativan 1mg  PO once for anxiety due to need of IV access.

## 2015-05-03 NOTE — Progress Notes (Signed)
Patient has not voided since admission, Vaginal bleeding also noted. Toya SmothersKaren Black, NP notified. New order for Foley catheter and repeat lactic acid.

## 2015-05-04 DIAGNOSIS — I959 Hypotension, unspecified: Secondary | ICD-10-CM | POA: Insufficient documentation

## 2015-05-04 LAB — BASIC METABOLIC PANEL
ANION GAP: 7 (ref 5–15)
BUN: 15 mg/dL (ref 6–20)
CO2: 22 mmol/L (ref 22–32)
Calcium: 9 mg/dL (ref 8.9–10.3)
Chloride: 114 mmol/L — ABNORMAL HIGH (ref 101–111)
Creatinine, Ser: 1.13 mg/dL — ABNORMAL HIGH (ref 0.44–1.00)
GFR, EST AFRICAN AMERICAN: 56 mL/min — AB (ref >60)
GFR, EST NON AFRICAN AMERICAN: 48 mL/min — AB (ref >60)
Glucose, Bld: 108 mg/dL — ABNORMAL HIGH (ref 65–99)
Potassium: 3.7 mmol/L (ref 3.5–5.1)
Sodium: 143 mmol/L (ref 135–145)

## 2015-05-04 LAB — CBC
HCT: 30.2 % — ABNORMAL LOW (ref 36.0–46.0)
Hemoglobin: 10 g/dL — ABNORMAL LOW (ref 12.0–15.0)
MCH: 27.2 pg (ref 26.0–34.0)
MCHC: 33.1 g/dL (ref 30.0–36.0)
MCV: 82.1 fL (ref 78.0–100.0)
Platelets: 290 10*3/uL (ref 150–400)
RBC: 3.68 MIL/uL — AB (ref 3.87–5.11)
RDW: 15.2 % (ref 11.5–15.5)
WBC: 7.7 10*3/uL (ref 4.0–10.5)

## 2015-05-04 LAB — GLUCOSE, CAPILLARY
GLUCOSE-CAPILLARY: 104 mg/dL — AB (ref 65–99)
Glucose-Capillary: 146 mg/dL — ABNORMAL HIGH (ref 65–99)
Glucose-Capillary: 92 mg/dL (ref 65–99)

## 2015-05-04 NOTE — Progress Notes (Signed)
Called patient's son Trey PaulaJeff Farrier x 3 times this shift regarding patient d/c home orders, no answer, voicemail left. Called patient's daughter Dorathy Daftmanda White and made her aware and she said she would try to get in contact with her brother Trey PaulaJeff Broker.

## 2015-05-04 NOTE — Care Management Note (Signed)
Case Management Note  Patient Details  Name: Chelsea Robinson MRN: [id number] Date of Birth: 03/24/1946   Expected Discharge Date:  05/05/15               Expected Discharge Plan:  Home/Self Care  In-House Referral:  NA  Discharge planning Services  CM Consult  Post Acute Care Choice:  NA Choice offered to:  NA  DME Arranged:    DME Agency:     HH Arranged:    HH Agency:     Status of Service:  Completed, signed off  Medicare Important Message Given:    Date Medicare IM Given:    Medicare IM give by:    Date Additional Medicare IM Given:    Additional Medicare Important Message give by:     If discussed at Long Length of Stay Meetings, dates discussed:    Additional Comments: Pt being discharged home today with resumption of previous home care arrangement. No CM needs.Chelsea Robinson,  Chelsea Apollo Demske, RN 05/04/2015, 11:01 AM

## 2015-05-04 NOTE — Progress Notes (Signed)
Contacted by pt's daughter, requesting she be contacted by attending to update her on her mothers health status. Daughter states she is interested in her mother being placed for short period due to her declining health status and nutritional needs. Discussed with pt's daughter the progression of dementia and daughter states that she is very aware of what to expect with disease progression. Daughter states [facility] and [facility] hospitalized for [facility] days. Explained to daughter the requirement to have a skilled need and discussed PT's recommendations. CSW made aware and will f/u with pt's daughter. MD paged and made aware daughter asking to be contacted for update on health status.

## 2015-05-04 NOTE — Discharge Summary (Signed)
Physician Discharge Summary  Chelsea Robinson ZOX:[id number] DOB: 14-Nov-1946 DOA: 05/02/2015  PCP: Delorse Lek, MD  Admit date: 05/02/2015 Discharge date: 05/04/2015  Time spent: 40 minutes  Recommendations for Outpatient Follow-up:  1. PCP 1 week to evaluate oral intake and ability to maintain hydration and nutrition as well as BP control and kidney function as medications adjusted. Suggest considering end of life discussion with plans and possible referral to paliative care for planning  Discharge Diagnoses:  Principal Problem:   Acute encephalopathy Active Problems:   Essential hypertension   Diarrhea   Diabetes mellitus without complication   Dementia   Renal disorder   Hypokalemia   Arterial hypotension   Discharge Condition: stable  Diet recommendation: regular  Filed Weights   05/02/15 0932 05/02/15 1543  Weight: 45.36 kg (100 lb) 52.2 kg (115 lb 1.3 oz)    History of present illness:  Chelsea Robinson is a 69 y.o. female with a past medical history that includes diabetes, dementia, hypertension, renal disorder presented to the emergency department on 05/02/15 with the chief complaint of altered mental status. Initial evaluation in the emergency department revealed hypotension, leukocytosis of 11.9 hypokalemia and renal failure. In addition patient had one very large watery stool.  Information obtained from the family who reportedgradual increased confusion over the previous several days but no fever chills nausea vomiting or diarrhea. No reported cough dizziness syncope or near-syncope. No recent falls. Granddaughter did report patient in the process of outpatient workup for "kidney trouble". Patients baseline is not very verbal unable to make wants and needs known incontinent of bladder and bowel which was fairly recent development.  Workup in the emergency department included chest x-ray and urinalysis that were unremarkable. EKG sinus rhythm, initial troponin negative, lactic  acid pending. C. difficile negative. Ova and parasite and GI pathogen PCR pending. Given 1 L of normal saline in ED and her blood pressure was 146/80 she was alert and at her baseline mentality.  Hospital Course:  1. Acute encephalopathy superimposed on dementia. Likely related to hypotension. Improved with IV fluid administration. Back to baseline at discharge. Concern that with advancing dementia her ability to maintain hydration/nutrition lessening. Recommend PCP address end of life care  2. Acute kidney injury with likely  element of chronic kidney disease. Creatinine 1.13 at discharge. Will continue to hold lisinopril, HCTZ and metformin until follow up appointment  with PCP 1 to check kidney function.  3. Diarrhea. Provided with rectal tube after watery stool in ED. No further stool. Stool C. Difficile negative as well as ova and parasites. 4. Diabetes. A1c 8.0. Fair control.  5. Anemia. Stable and likely chronic. No evidence of bleeding.    Procedures:  none  Consultations:  none  Discharge Exam: Filed Vitals:   05/04/15 0429  BP: 132/70  Pulse: 62  Temp: 98.3 F (36.8 C)  Resp: 16    General: thin somewhat frail Cardiovascular: RRR no MGR No LE edema Respiratory: normal effort BS clear bilaterally no wheeze  Discharge Instructions   Discharge Instructions    Diet - low sodium heart healthy    Complete by:  As directed      Discharge instructions    Complete by:  As directed   Take medications as directed.  Follow up with PCP 1 week for evaluation of oral intake and ability to maintain hydration Offer fluids frequently     Increase activity slowly    Complete by:  As directed  Current Discharge Medication List    CONTINUE these medications which have NOT CHANGED   Details  aspirin 81 MG tablet Take 81 mg by mouth daily.    cyanocobalamin 500 MCG tablet Take 500 mcg by mouth daily.    LORazepam (ATIVAN) 0.5 MG tablet Take 0.5 mg by mouth at  bedtime.       STOP taking these medications     lisinopril-hydrochlorothiazide (PRINZIDE,ZESTORETIC) 20-12.5 MG per tablet      metFORMIN (GLUCOPHAGE) 500 MG tablet      NAMENDA XR 28 MG CP24 24 hr capsule        Not on File Follow-up Information    Follow up with Delorse LekBURNETT,BRENT A, MD.   Specialty:  Family Medicine   Contact information:   [address] [place] [address] [phone]        The results of significant diagnostics from this hospitalization (including imaging, microbiology, ancillary and laboratory) are listed below for reference.    Significant Diagnostic Studies: Dg Chest 1 View  05/02/2015   CLINICAL DATA:  Hypoxia. Hypotension. Increase confusion for 1 week.  EXAM: CHEST  1 VIEW  COMPARISON:  04/07/2015  FINDINGS: The heart size and mediastinal contours are within normal limits. Both lungs are clear. Previously described left lower lobe opacity is no longer visualized on this exam. No evidence of pleural effusion or pneumothorax. The visualized skeletal structures are unremarkable.  IMPRESSION: No active disease.   Electronically Signed   By: Myles RosenthalJohn  Stahl M.D.   On: 05/02/2015 13:04    Microbiology: Recent Results (from the past 240 hour(s))  Ova and parasite examination     Status: None   Collection Time: 05/02/15 11:00 AM  Result Value Ref Range Status   Specimen Description STOOL  Final   Special Requests NONE  Final   Ova and parasites   Final    NO OVA OR PARASITES SEEN [facility]formed [facility]    Report Status 05/03/2015 FINAL  Final  Clostridium Difficile by PCR     Status: None   Collection Time: 05/02/15 11:00 AM  Result Value Ref Range Status   C difficile by pcr NEGATIVE NEGATIVE Final  Culture, blood (routine x 2)     Status: None (Preliminary result)   Collection Time: 05/02/15 11:10 AM  Result Value Ref Range Status   Specimen Description BLOOD LEFT HAND  Final   Special Requests BOTTLES DRAWN AEROBIC ONLY  4CC  Final   Culture NO GROWTH 1 DAY  Final   Report Status PENDING  Incomplete  Urine culture     Status: None   Collection Time: 05/02/15 11:55 AM  Result Value Ref Range Status   Specimen Description URINE, CATHETERIZED  Final   Special Requests NONE  Final   Colony Count NO GROWTH [facility]formed at [facility]   Final   Culture NO GROWTH [facility]formed at [facility]   Final   Report Status 05/03/2015 FINAL  Final  Culture, blood (routine x 2)     Status: None (Preliminary result)   Collection Time: 05/02/15 11:59 AM  Result Value Ref Range Status   Specimen Description BLOOD RIGHT HAND  Final   Special Requests BOTTLES DRAWN AEROBIC ONLY 4CC  Final   Culture NO GROWTH 1 DAY  Final   Report Status PENDING  Incomplete     Labs: Basic Metabolic Panel:  Recent Labs Lab 05/02/15 1140 05/02/15 1457 05/03/15 0530 05/04/15 0525  NA  141  --  142 143  K 3.1*  --  4.1 3.7  CL 108  --  114* 114*  CO2 19*  --  20* 22  GLUCOSE 168*  --  106* 108*  BUN 19  --  21* 15  CREATININE 1.45*  --  1.58* 1.13*  CALCIUM 9.5  --  8.8* 9.0  MG  --  1.6* 1.6*  --    Liver Function Tests:  Recent Labs Lab 05/02/15 1140  AST 20  ALT 11*  ALKPHOS 44  BILITOT 0.8  PROT 7.1  ALBUMIN 3.8   No results for input(s): LIPASE, AMYLASE in the last 168 hours. No results for input(s): AMMONIA in the last 168 hours. CBC:  Recent Labs Lab 05/02/15 1159 05/03/15 0530 05/04/15 0525  WBC 11.9* 9.9 7.7  HGB 12.0 9.2* 10.0*  HCT 37.1 27.5* 30.2*  MCV 82.8 81.8 82.1  PLT 212 248 290   Cardiac Enzymes:  Recent Labs Lab 05/02/15 1140 05/02/15 2002 05/03/15 0530  TROPONINI <0.03 <0.03 <0.03   BNP: BNP (last 3 results) No results for input(s): BNP in the last 8760 hours.  ProBNP (last 3 results) No results for input(s): PROBNP in the last 8760 hours.  CBG:  Recent Labs Lab 05/03/15 1151 05/03/15 1627 05/03/15 2039 05/04/15 0848 05/04/15 1322  GLUCAP 158* 156* 90  104* 146*       Signed:  Parke Jandreau M  [facility] 05/04/2015, 2:25 PM

## 2015-05-04 NOTE — Progress Notes (Signed)
1705 Patient's son Trey PaulaJeff Oglesby arrived to pick up patient. D/C instructions and paperwork given to patient's son. Foley catheter removed, intact, patient tolerated well. Flexiseal rectal tube removed, intact, patient tolerated well, no BM output noted this shift. No IV catheter to remove. Patient's son aware to take patient to f/u apt with PCP Dr.Burnett on 05/15/15.

## 2015-05-04 NOTE — Clinical Social Work Note (Signed)
Clinical Social Work Assessment  Patient Details  Name: Chelsea Robinson MRN: [id number] Date of Birth: 04/22/1946  Date of referral:  05/04/15               Reason for consult:  Facility Placement                Permission sought to share information with:    Permission granted to share information::     Name::        Agency::     Relationship::     Contact Information:     Housing/Transportation Living arrangements for the past 2 months:  Single Family Home Source of Information:  Adult Children Patient Interpreter Needed:  None Criminal Activity/Legal Involvement Pertinent to Current Situation/Hospitalization:  No - Comment as needed Significant Relationships:  Adult Children, Spouse Lives with:  Adult Children Do you feel safe going back to the place where you live?  Yes Need for family participation in patient care:  Yes (Comment)  Care giving concerns:  Pt requires around the clock supervision due to dementia.    Social Worker assessment / plan:  CSW spoke with pt's daughter, Chelsea Robinson after conversation with CM indicating family was interested in placement. Pt oriented to self only due to dementia. Chelsea Robinson said she generally makes decisions, but there is no HCPOA. She asked about this and CSW suggested that they may need to consider guardianship instead. Pt has been living at home with children. Her husband is currently in rehab after a car accident. Pt goes to adult daycare during the day and family is with her the rest of the time. Chelsea Robinson asked about placement and CSW shared that she does not have a skilled need. Chelsea Robinson appeared to be understanding of this. CSW offered ALF as an option. She states that they have already talked to DSS and pt would not qualify for Medicaid and they cannot afford private pay. She understands that pt is d/c today and said that she will return home.   Employment status:  Retired Health and safety inspectornsurance information:  Medicare PT Recommendations:  No Follow  Up Information / Referral to community resources:   (Daughter refuses ALF)  Patient/Family's Response to care:  Daughter understands that pt does not have skilled need and states that her brother will pick up pt this afternoon.   Patient/Family's Understanding of and Emotional Response to Diagnosis, Current Treatment, and Prognosis:  Pt's daughter reports understanding of progression of dementia. She is concerned about her more frequent hospital stays recently and wants to consider any options that may help. NP to call daughter today.   Emotional Assessment Appearance:  Other (Comment Required (not assessed) Attitude/Demeanor/Rapport:  Unable to Assess Affect (typically observed):  Unable to Assess Orientation:  Oriented to Self Alcohol / Substance use:  Not Applicable Psych involvement (Current and /or in the community):  No (Comment)  Discharge Needs  Concerns to be addressed:  No discharge needs identified Readmission within the last 30 days:  No Current discharge risk:  Cognitively Impaired Barriers to Discharge:  No Barriers Identified   Karn CassisStultz, Helmer Dull [place], LCSW 05/04/2015, 12:34 PM [phone]

## 2015-05-07 LAB — CULTURE, BLOOD (ROUTINE X 2)
Culture: NO GROWTH
Culture: NO GROWTH

## 2015-07-22 ENCOUNTER — Emergency Department (HOSPITAL_COMMUNITY): Payer: Medicare Other

## 2015-07-22 ENCOUNTER — Emergency Department (HOSPITAL_COMMUNITY)
Admission: EM | Admit: 2015-07-22 | Discharge: 2015-07-22 | Disposition: A | Discharge status: Home / Self Care | Payer: Medicare Other | Attending: Emergency Medicine | Admitting: Emergency Medicine

## 2015-07-22 ENCOUNTER — Encounter (HOSPITAL_COMMUNITY): Payer: Self-pay | Admitting: Emergency Medicine

## 2015-07-22 DIAGNOSIS — E119 Type 2 diabetes mellitus without complications: Secondary | ICD-10-CM | POA: Insufficient documentation

## 2015-07-22 DIAGNOSIS — Z7982 Long term (current) use of aspirin: Secondary | ICD-10-CM | POA: Diagnosis not present

## 2015-07-22 DIAGNOSIS — F0390 Unspecified dementia without behavioral disturbance: Secondary | ICD-10-CM | POA: Diagnosis not present

## 2015-07-22 DIAGNOSIS — Z87448 Personal history of other diseases of urinary system: Secondary | ICD-10-CM | POA: Insufficient documentation

## 2015-07-22 DIAGNOSIS — R197 Diarrhea, unspecified: Secondary | ICD-10-CM | POA: Diagnosis not present

## 2015-07-22 DIAGNOSIS — I10 Essential (primary) hypertension: Secondary | ICD-10-CM | POA: Diagnosis not present

## 2015-07-22 DIAGNOSIS — Z79899 Other long term (current) drug therapy: Secondary | ICD-10-CM | POA: Insufficient documentation

## 2015-07-22 LAB — COMPREHENSIVE METABOLIC PANEL
ALT: 20 U/L (ref 14–54)
ANION GAP: 11 (ref 5–15)
AST: 17 U/L (ref 15–41)
Albumin: 3.8 g/dL (ref 3.5–5.0)
Alkaline Phosphatase: 50 U/L (ref 38–126)
BUN: 14 mg/dL (ref 6–20)
CHLORIDE: 101 mmol/L (ref 101–111)
CO2: 26 mmol/L (ref 22–32)
Calcium: 9.5 mg/dL (ref 8.9–10.3)
Creatinine, Ser: 1.08 mg/dL — ABNORMAL HIGH (ref 0.44–1.00)
GFR calc Af Amer: 59 mL/min — ABNORMAL LOW (ref >60)
GFR calc non Af Amer: 51 mL/min — ABNORMAL LOW (ref >60)
Glucose, Bld: 158 mg/dL — ABNORMAL HIGH (ref 65–99)
POTASSIUM: 4 mmol/L (ref 3.5–5.1)
Sodium: 138 mmol/L (ref 135–145)
Total Bilirubin: 1 mg/dL (ref 0.3–1.2)
Total Protein: 7.4 g/dL (ref 6.5–8.1)

## 2015-07-22 LAB — CBC WITH DIFFERENTIAL/PLATELET
BASOS ABS: 0 10*3/uL (ref 0.0–0.1)
BASOS PCT: 0 % (ref 0–1)
Eosinophils Absolute: 0.1 10*3/uL (ref 0.0–0.7)
Eosinophils Relative: 0 % (ref 0–5)
HCT: 38.5 % (ref 36.0–46.0)
Hemoglobin: 13.1 g/dL (ref 12.0–15.0)
Lymphocytes Relative: 14 % (ref 12–46)
Lymphs Abs: 2.1 10*3/uL (ref 0.7–4.0)
MCH: 27.8 pg (ref 26.0–34.0)
MCHC: 34 g/dL (ref 30.0–36.0)
MCV: 81.7 fL (ref 78.0–100.0)
MONO ABS: 1.7 10*3/uL — AB (ref 0.1–1.0)
Monocytes Relative: 11 % (ref 3–12)
Neutro Abs: 11.5 10*3/uL — ABNORMAL HIGH (ref 1.7–7.7)
Neutrophils Relative %: 75 % (ref 43–77)
Platelets: 327 10*3/uL (ref 150–400)
RBC: 4.71 MIL/uL (ref 3.87–5.11)
RDW: 14.6 % (ref 11.5–15.5)
WBC: 15.4 10*3/uL — ABNORMAL HIGH (ref 4.0–10.5)

## 2015-07-22 LAB — URINE MICROSCOPIC-ADD ON

## 2015-07-22 LAB — URINALYSIS, ROUTINE W REFLEX MICROSCOPIC
Bilirubin Urine: NEGATIVE
GLUCOSE, UA: 100 mg/dL — AB
Hgb urine dipstick: NEGATIVE
KETONES UR: NEGATIVE mg/dL
NITRITE: NEGATIVE
PH: 6 (ref 5.0–8.0)
Protein, ur: NEGATIVE mg/dL
SPECIFIC GRAVITY, URINE: 1.015 (ref 1.005–1.030)
Urobilinogen, UA: 0.2 mg/dL (ref 0.0–1.0)

## 2015-07-22 MED ORDER — SODIUM CHLORIDE 0.9 % IV SOLN
INTRAVENOUS | Status: DC
  Administered 2015-07-22: 19:00:00 via INTRAVENOUS

## 2015-07-22 MED ORDER — SODIUM CHLORIDE 0.9 % IV BOLUS (SEPSIS)
500.0000 mL | Freq: Once | INTRAVENOUS | Status: AC
  Administered 2015-07-22: 500 mL via INTRAVENOUS

## 2015-07-22 NOTE — ED Notes (Signed)
Attempted to call report on pt returning to facility. no answer x2

## 2015-07-22 NOTE — ED Notes (Addendum)
Pt w/ small amount of stool in diaper, not enough for sample. Pt cleaned & another diaper placed.

## 2015-07-22 NOTE — ED Notes (Signed)
Patient with soiled depends. Peri-care completed and replaced depends. Patient only follows very simple commands and will not cooperate with staff at times. Rectal temperature obtained. Daughter at bedside.

## 2015-07-22 NOTE — ED Provider Notes (Signed)
CSN: [id number]     Arrival date & time 07/22/15  1724 History   First MD Initiated Contact with Patient 07/22/15 1727     Chief Complaint  Patient presents with  . Diarrhea     (Consider location/radiation/quality/duration/timing/severity/associated sxs/prior Treatment) Patient is a 69 y.o. female presenting with diarrhea. The history is provided by the EMS personnel, the nursing home and a relative. The history is limited by the condition of the patient.  Diarrhea  patient with history of dementia from nursing facility. According to patient's daughter patient said several runny bowel movements without any blood starting yesterday maybe a total of 7 according to family member. Patient also not taking by mouth liquids or food very well. Patient has a history of dementia. No vomiting reported low-grade fevers.  Past Medical History  Diagnosis Date  . Diabetes mellitus without complication   . Dementia   . Hypertension   . Renal disorder    History reviewed. No pertinent past surgical history. No family history on file. Social History  Substance Use Topics  . Smoking status: Never Smoker   . Smokeless tobacco: None  . Alcohol Use: No   OB History    No data available     Review of Systems  Unable to perform ROS Gastrointestinal: Positive for diarrhea.   level V caveat applies due to the patient's dementia. Patient from nursing facility.    Allergies  Review of patient's allergies indicates no known allergies.  Home Medications   Prior to Admission medications   Medication Sig Start Date End Date Taking? Authorizing Provider  aspirin 81 MG tablet Take 81 mg by mouth daily.   Yes Historical Provider, MD  hydrochlorothiazide (HYDRODIURIL) 12.5 MG tablet Take 12.5 mg by mouth daily.   Yes Historical Provider, MD  loperamide (IMODIUM A-D) 2 MG tablet Take 2 mg by mouth 4 (four) times daily as needed for diarrhea or loose stools.   Yes Historical Provider, MD  LORazepam  (ATIVAN) 0.5 MG tablet Take 0.5 mg by mouth at bedtime as needed for anxiety or sleep.  04/16/15  Yes Historical Provider, MD  megestrol (MEGACE) 40 MG/ML suspension Take 400 mg by mouth 2 (two) times daily.   Yes Historical Provider, MD  metFORMIN (GLUCOPHAGE) 500 MG tablet Take 500 mg by mouth 2 (two) times daily with a meal.   Yes Historical Provider, MD  mirtazapine (REMERON) 15 MG tablet Take 15 mg by mouth at bedtime.   Yes Historical Provider, MD  QUEtiapine (SEROQUEL) 50 MG tablet Take 50 mg by mouth 3 (three) times daily.   Yes Historical Provider, MD   BP 138/82 mmHg  Pulse 96  Temp(Src) 99.8 F (37.7 C) (Rectal)  Resp 18  Wt 115 lb (52.164 kg)  SpO2 98% Physical Exam  Constitutional: She appears well-developed and well-nourished. No distress.  HENT:  Head: Normocephalic and atraumatic.  Mouth/Throat: Oropharynx is clear and moist.  Eyes: Conjunctivae and EOM are normal. Pupils are equal, round, and reactive to light.  Neck: Normal range of motion. Neck supple.  Cardiovascular: Normal rate, regular rhythm and normal heart sounds.   No murmur heard. Pulmonary/Chest: Effort normal and breath sounds normal. No respiratory distress.  Abdominal: Soft. Bowel sounds are normal. She exhibits no distension. There is no tenderness.  Musculoskeletal: Normal range of motion. She exhibits no edema.  Neurological: She is alert. No cranial nerve deficit. She exhibits normal muscle tone. Coordination normal.  Skin: Skin is warm. No rash noted.  Nursing  note and vitals reviewed.   ED Course  Procedures (including critical care time) Labs Review Labs Reviewed  CBC WITH DIFFERENTIAL/PLATELET - Abnormal; Notable for the following:    WBC 15.4 (*)    Neutro Abs 11.5 (*)    Monocytes Absolute 1.7 (*)    All other components within normal limits  COMPREHENSIVE METABOLIC PANEL - Abnormal; Notable for the following:    Glucose, Bld 158 (*)    Creatinine, Ser 1.08 (*)    GFR calc non Af Amer  51 (*)    GFR calc Af Amer 59 (*)    All other components within normal limits  URINALYSIS, ROUTINE W REFLEX MICROSCOPIC (NOT AT [facility]) - Abnormal; Notable for the following:    Glucose, UA 100 (*)    Leukocytes, UA SMALL (*)    All other components within normal limits  URINE MICROSCOPIC-ADD ON - Abnormal; Notable for the following:    Squamous Epithelial / LPF FEW (*)    Bacteria, UA FEW (*)    All other components within normal limits  URINE CULTURE   Results for orders placed or performed during the hospital encounter of 07/22/15  CBC with Differential/Platelet  Result Value Ref Range   WBC 15.4 (H) 4.0 - 10.5 K/uL   RBC 4.71 3.87 - 5.11 MIL/uL   Hemoglobin 13.1 12.0 - 15.0 g/dL   HCT 75.6 43.3 - 29.5 %   MCV 81.7 78.0 - 100.0 fL   MCH 27.8 26.0 - 34.0 pg   MCHC 34.0 30.0 - 36.0 g/dL   RDW 18.8 41.6 - 60.6 %   Platelets 327 150 - 400 K/uL   Neutrophils Relative % 75 43 - 77 %   Neutro Abs 11.5 (H) 1.7 - 7.7 K/uL   Lymphocytes Relative 14 12 - 46 %   Lymphs Abs 2.1 0.7 - 4.0 K/uL   Monocytes Relative 11 3 - 12 %   Monocytes Absolute 1.7 (H) 0.1 - 1.0 K/uL   Eosinophils Relative 0 0 - 5 %   Eosinophils Absolute 0.1 0.0 - 0.7 K/uL   Basophils Relative 0 0 - 1 %   Basophils Absolute 0.0 0.0 - 0.1 K/uL  Comprehensive metabolic panel  Result Value Ref Range   Sodium 138 135 - 145 mmol/L   Potassium 4.0 3.5 - 5.1 mmol/L   Chloride 101 101 - 111 mmol/L   CO2 26 22 - 32 mmol/L   Glucose, Bld 158 (H) 65 - 99 mg/dL   BUN 14 6 - 20 mg/dL   Creatinine, Ser 3.01 (H) 0.44 - 1.00 mg/dL   Calcium 9.5 8.9 - 60.1 mg/dL   Total Protein 7.4 6.5 - 8.1 g/dL   Albumin 3.8 3.5 - 5.0 g/dL   AST 17 15 - 41 U/L   ALT 20 14 - 54 U/L   Alkaline Phosphatase 50 38 - 126 U/L   Total Bilirubin 1.0 0.3 - 1.2 mg/dL   GFR calc non Af Amer 51 (L) >60 mL/min   GFR calc Af Amer 59 (L) >60 mL/min   Anion gap 11 5 - 15  Urinalysis, Routine w reflex microscopic (not at [facility])  Result Value Ref Range    Color, Urine YELLOW YELLOW   APPearance CLEAR CLEAR   Specific Gravity, Urine 1.015 1.005 - 1.030   pH 6.0 5.0 - 8.0   Glucose, UA 100 (A) NEGATIVE mg/dL   Hgb urine dipstick NEGATIVE NEGATIVE   Bilirubin Urine NEGATIVE NEGATIVE   Ketones, ur NEGATIVE  NEGATIVE mg/dL   Protein, ur NEGATIVE NEGATIVE mg/dL   Urobilinogen, UA 0.2 0.0 - 1.0 mg/dL   Nitrite NEGATIVE NEGATIVE   Leukocytes, UA SMALL (A) NEGATIVE  Urine microscopic-add on  Result Value Ref Range   Squamous Epithelial / LPF FEW (A) RARE   WBC, UA 7-10 <3 WBC/hpf   RBC / HPF 0-2 <3 RBC/hpf   Bacteria, UA FEW (A) RARE     Imaging Review Dg Abd Acute W/chest  07/22/2015   CLINICAL DATA:  Pt brought in from SNF by [facility]. Facility noted "several runny stools" since this morning and daughter reports that pt hasn't been able to keep anything down. Daughter reports no recent abx use. Last infection was UTI about 72mo prior.  EXAM: DG ABDOMEN ACUTE W/ 1V CHEST  COMPARISON:  None.  FINDINGS: There is no evidence of bowel obstruction and no free air.  Moderate increased stool is noted in the colon, most evident in the rectosigmoid and right colon.  There is no evidence of renal or ureteral stones. There are scattered vascular calcifications in the pelvis.  IMPRESSION: 1. No acute findings.  No evidence of bowel obstruction or free air. 2. Moderate increased stool in the colon rectum.   Electronically Signed   By: Amie Portland M.D.   On: 07/22/2015 19:49   I have personally reviewed and evaluated these images and lab results as part of my medical decision-making.   EKG Interpretation None      MDM   Final diagnoses:  Diarrhea    Patient from nursing facility does have a history of dementia. Family concerned because of onset of diarrhea yesterday not much by mouth intake. At about 7 loose bowel movements no blood. No vomiting.  Workup here today with some leukocytosis but electrolytes without significant abnormalities. Vital signs  without significant fever or hypotension. Patient's urinalysis with questionable urinary tract infection. Culture sent but will not treat.  Plain films of the abdomen and chest without acute abnormalities. But there is some evidence of moderate increase in stool in the colon and rectum. Possible some loose bowel movement movements could be fluid moving around that. But patient does not have any abdominal distention at all or tenderness. Nursing facility are restarted her on Imodium will have that continued and have them follow-up with her doctor and return for any new or worse symptoms.    Vanetta Mulders, MD 07/22/15 2030

## 2015-07-22 NOTE — ED Notes (Signed)
Spoke w/ Shanda Bumps from [facility],  & gave discharge instructions & informed pt was being returned by family member.

## 2015-07-22 NOTE — Discharge Instructions (Signed)
Continue the Imodium return for any new or worse symptoms. Try to encourage fluids.

## 2015-07-22 NOTE — ED Notes (Addendum)
Pt brought in from SNF by [facility]. Facility noted "several runny stools" since this morning and daughter reports that pt hasn't been able to keep anything down. Daughter reports no recent abx use. Last infection was UTI about 17mo prior.

## 2015-07-22 NOTE — ED Notes (Signed)
Pt alert. Family member given discharge instructions, paperwork & prescription(s). Facility called & went over discharge instructions. Family & staff verbalized understanding. Pt left department in wheelchair assisted by staff. No further questions.

## 2015-07-24 LAB — URINE CULTURE: CULTURE: NO GROWTH

## 2015-07-27 ENCOUNTER — Encounter: Payer: Self-pay | Admitting: Internal Medicine

## 2015-08-15 ENCOUNTER — Ambulatory Visit: Payer: Medicare Other | Admitting: Gastroenterology

## 2016-02-09 ENCOUNTER — Inpatient Hospital Stay (HOSPITAL_COMMUNITY)
Admission: EM | Admit: 2016-02-09 | Discharge: 2016-02-12 | DRG: 641 | Disposition: A | Discharge status: Skilled Nursing Facility | Payer: Medicare Other | Attending: Internal Medicine | Admitting: Internal Medicine

## 2016-02-09 ENCOUNTER — Encounter (HOSPITAL_COMMUNITY): Payer: Self-pay | Admitting: Emergency Medicine

## 2016-02-09 ENCOUNTER — Emergency Department (HOSPITAL_COMMUNITY): Payer: Medicare Other

## 2016-02-09 DIAGNOSIS — R4182 Altered mental status, unspecified: Secondary | ICD-10-CM | POA: Diagnosis present

## 2016-02-09 DIAGNOSIS — Z7189 Other specified counseling: Secondary | ICD-10-CM | POA: Insufficient documentation

## 2016-02-09 DIAGNOSIS — N183 Chronic kidney disease, stage 3 unspecified: Secondary | ICD-10-CM | POA: Diagnosis present

## 2016-02-09 DIAGNOSIS — I129 Hypertensive chronic kidney disease with stage 1 through stage 4 chronic kidney disease, or unspecified chronic kidney disease: Secondary | ICD-10-CM | POA: Diagnosis present

## 2016-02-09 DIAGNOSIS — E860 Dehydration: Secondary | ICD-10-CM | POA: Diagnosis present

## 2016-02-09 DIAGNOSIS — F0390 Unspecified dementia without behavioral disturbance: Secondary | ICD-10-CM | POA: Diagnosis present

## 2016-02-09 DIAGNOSIS — R627 Adult failure to thrive: Secondary | ICD-10-CM | POA: Diagnosis not present

## 2016-02-09 DIAGNOSIS — G309 Alzheimer's disease, unspecified: Secondary | ICD-10-CM | POA: Diagnosis present

## 2016-02-09 DIAGNOSIS — E1122 Type 2 diabetes mellitus with diabetic chronic kidney disease: Secondary | ICD-10-CM | POA: Diagnosis present

## 2016-02-09 DIAGNOSIS — Z515 Encounter for palliative care: Secondary | ICD-10-CM | POA: Insufficient documentation

## 2016-02-09 DIAGNOSIS — E119 Type 2 diabetes mellitus without complications: Secondary | ICD-10-CM

## 2016-02-09 DIAGNOSIS — F0391 Unspecified dementia with behavioral disturbance: Secondary | ICD-10-CM

## 2016-02-09 HISTORY — DX: Alzheimer's disease, unspecified: G30.9

## 2016-02-09 HISTORY — DX: Alzheimer's disease, unspecified: F02.80

## 2016-02-09 LAB — BASIC METABOLIC PANEL
Anion gap: 9 (ref 5–15)
BUN: 21 mg/dL — AB (ref 6–20)
CHLORIDE: 103 mmol/L (ref 101–111)
CO2: 23 mmol/L (ref 22–32)
Calcium: 9.2 mg/dL (ref 8.9–10.3)
Creatinine, Ser: 1.2 mg/dL — ABNORMAL HIGH (ref 0.44–1.00)
GFR calc Af Amer: 51 mL/min — ABNORMAL LOW (ref >60)
GFR calc non Af Amer: 44 mL/min — ABNORMAL LOW (ref >60)
GLUCOSE: 126 mg/dL — AB (ref 65–99)
POTASSIUM: 3.8 mmol/L (ref 3.5–5.1)
SODIUM: 135 mmol/L (ref 135–145)

## 2016-02-09 LAB — URINALYSIS, ROUTINE W REFLEX MICROSCOPIC
Bilirubin Urine: NEGATIVE
GLUCOSE, UA: NEGATIVE mg/dL
Ketones, ur: NEGATIVE mg/dL
Nitrite: NEGATIVE
Protein, ur: NEGATIVE mg/dL
pH: 5.5 (ref 5.0–8.0)

## 2016-02-09 LAB — CBC WITH DIFFERENTIAL/PLATELET
Basophils Absolute: 0 10*3/uL (ref 0.0–0.1)
Basophils Relative: 0 %
EOS PCT: 1 %
Eosinophils Absolute: 0.1 10*3/uL (ref 0.0–0.7)
HEMATOCRIT: 34 % — AB (ref 36.0–46.0)
Hemoglobin: 11.4 g/dL — ABNORMAL LOW (ref 12.0–15.0)
LYMPHS ABS: 2.5 10*3/uL (ref 0.7–4.0)
LYMPHS PCT: 25 %
MCH: 27.7 pg (ref 26.0–34.0)
MCHC: 33.5 g/dL (ref 30.0–36.0)
MCV: 82.5 fL (ref 78.0–100.0)
Monocytes Absolute: 0.9 10*3/uL (ref 0.1–1.0)
Monocytes Relative: 9 %
NEUTROS ABS: 6.4 10*3/uL (ref 1.7–7.7)
Neutrophils Relative %: 65 %
PLATELETS: 347 10*3/uL (ref 150–400)
RBC: 4.12 MIL/uL (ref 3.87–5.11)
RDW: 14 % (ref 11.5–15.5)
WBC: 9.8 10*3/uL (ref 4.0–10.5)

## 2016-02-09 LAB — URINE MICROSCOPIC-ADD ON

## 2016-02-09 LAB — GLUCOSE, CAPILLARY: Glucose-Capillary: 99 mg/dL (ref 65–99)

## 2016-02-09 LAB — CBG MONITORING, ED: GLUCOSE-CAPILLARY: 91 mg/dL (ref 65–99)

## 2016-02-09 LAB — AMMONIA: AMMONIA: 12 umol/L (ref 9–35)

## 2016-02-09 MED ORDER — SODIUM CHLORIDE 0.9 % IV BOLUS (SEPSIS)
500.0000 mL | Freq: Once | INTRAVENOUS | Status: AC
Start: 2016-02-09 — End: 2016-02-09
  Administered 2016-02-09: 500 mL via INTRAVENOUS

## 2016-02-09 MED ORDER — INSULIN ASPART 100 UNIT/ML ~~LOC~~ SOLN
0.0000 [IU] | SUBCUTANEOUS | Status: DC
  Administered 2016-02-10: 2 [IU] via SUBCUTANEOUS
  Administered 2016-02-10: 1 [IU] via SUBCUTANEOUS
  Administered 2016-02-11 – 2016-02-12 (×2): 2 [IU] via SUBCUTANEOUS

## 2016-02-09 MED ORDER — VITAMIN D 1000 UNITS PO TABS
2000.0000 [IU] | ORAL_TABLET | Freq: Every day | ORAL | Status: DC
  Administered 2016-02-12: 2000 [IU] via ORAL
  Filled 2016-02-09 (×3): qty 2

## 2016-02-09 MED ORDER — ATORVASTATIN CALCIUM 10 MG PO TABS
10.0000 mg | ORAL_TABLET | Freq: Every day | ORAL | Status: DC
  Administered 2016-02-11: 10 mg via ORAL
  Filled 2016-02-09: qty 1

## 2016-02-09 MED ORDER — MEGESTROL ACETATE 400 MG/10ML PO SUSP
400.0000 mg | Freq: Two times a day (BID) | ORAL | Status: DC
  Administered 2016-02-11 – 2016-02-12 (×2): 400 mg via ORAL
  Filled 2016-02-09 (×6): qty 10

## 2016-02-09 MED ORDER — ASPIRIN 81 MG PO CHEW
81.0000 mg | CHEWABLE_TABLET | Freq: Every day | ORAL | Status: DC
  Administered 2016-02-12: 81 mg via ORAL
  Filled 2016-02-09: qty 1

## 2016-02-09 MED ORDER — LEVALBUTEROL HCL 0.63 MG/3ML IN NEBU: 0.6300 mg | INHALATION_SOLUTION | Freq: Four times a day (QID) | RESPIRATORY_TRACT | Status: DC | PRN

## 2016-02-09 MED ORDER — LOPERAMIDE HCL 2 MG PO CAPS: 2.0000 mg | ORAL_CAPSULE | Freq: Four times a day (QID) | ORAL | Status: DC | PRN

## 2016-02-09 MED ORDER — SODIUM CHLORIDE 0.9 % IV SOLN
INTRAVENOUS | Status: DC
  Administered 2016-02-09 – 2016-02-11 (×4): via INTRAVENOUS

## 2016-02-09 MED ORDER — ONDANSETRON HCL 4 MG/2ML IJ SOLN: 4.0000 mg | Freq: Four times a day (QID) | INTRAMUSCULAR | Status: DC | PRN

## 2016-02-09 MED ORDER — ENOXAPARIN SODIUM 40 MG/0.4ML ~~LOC~~ SOLN
40.0000 mg | SUBCUTANEOUS | Status: DC
  Administered 2016-02-09 – 2016-02-11 (×3): 40 mg via SUBCUTANEOUS
  Filled 2016-02-09 (×3): qty 0.4

## 2016-02-09 MED ORDER — LORAZEPAM 0.5 MG PO TABS: 0.5000 mg | ORAL_TABLET | Freq: Every evening | ORAL | Status: DC | PRN

## 2016-02-09 MED ORDER — ONDANSETRON HCL 4 MG PO TABS: 4.0000 mg | ORAL_TABLET | Freq: Four times a day (QID) | ORAL | Status: DC | PRN

## 2016-02-09 MED ORDER — POLYETHYLENE GLYCOL 3350 17 G PO PACK: 17.0000 g | PACK | Freq: Every day | ORAL | Status: DC | PRN

## 2016-02-09 MED ORDER — ACETAMINOPHEN 325 MG PO TABS: 650.0000 mg | ORAL_TABLET | Freq: Four times a day (QID) | ORAL | Status: DC | PRN

## 2016-02-09 MED ORDER — ACETAMINOPHEN 650 MG RE SUPP: 650.0000 mg | Freq: Four times a day (QID) | RECTAL | Status: DC | PRN

## 2016-02-09 NOTE — ED Provider Notes (Signed)
CSN: [id number]     Arrival date & time 02/09/16  1505 History   First MD Initiated Contact with Patient 02/09/16 1539     Chief Complaint  Patient presents with  . Altered Mental Status     (Consider location/radiation/quality/duration/timing/severity/associated sxs/prior Treatment) HPI   Chelsea Robinson is a 70 y.o. female brought in by family members who were concerned that she is "not eating." She has recently been increased to 20 mg of Remeron, each day, and her son feels that she is more sleepy since that was started. The patient lives in a dementia unit, and does not ambulate. 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 She spends her day hours in a wheelchair, and sleeps in bed at night. Today, the son brought her here, and had to carry her from her bed, to his car. The patient cannot contribute to history. Her son does not feel that she has been coughing or vomiting. She has been getting physical therapy, 2 or 3 days a week, for 2 weeks, at the request of her dementia doctor.  Level V caveat- dementia    Past Medical History  Diagnosis Date  . Diabetes mellitus without complication (HCC)   . Dementia   . Hypertension   . Renal disorder   . Alzheimer disease    History reviewed. No pertinent past surgical history. History reviewed. No pertinent family history. Social History  Substance Use Topics  . Smoking status: Never Smoker   . Smokeless tobacco: None  . Alcohol Use: No   OB History    No data available     Review of Systems  Unable to perform ROS: Dementia      Allergies  Review of patient's allergies indicates no known allergies.  Home Medications   Prior to Admission medications   Medication Sig Start Date End Date Taking? Authorizing Provider  aspirin 81 MG tablet Take 81 mg by mouth daily.   Yes Historical Provider, MD  atorvastatin (LIPITOR) 10 MG tablet Take 10 mg by mouth at bedtime.   Yes Historical Provider, MD  cholecalciferol  (VITAMIN D) 1000 units tablet Take 2,000 Units by mouth daily.   Yes Historical Provider, MD  glimepiride (AMARYL) 2 MG tablet Take 1 mg by mouth daily with breakfast.   Yes Historical Provider, MD  hydrochlorothiazide (HYDRODIURIL) 12.5 MG tablet Take 12.5 mg by mouth daily.   Yes Historical Provider, MD  megestrol (MEGACE) 40 MG/ML suspension Take 400 mg by mouth 2 (two) times daily.   Yes Historical Provider, MD  memantine (NAMENDA) 10 MG tablet Take 20 mg by mouth daily.   Yes Historical Provider, MD  metFORMIN (GLUCOPHAGE) 500 MG tablet Take 500 mg by mouth 2 (two) times daily with a meal.   Yes Historical Provider, MD  polyethylene glycol (MIRALAX / GLYCOLAX) packet Take 17 g by mouth daily.   Yes Historical Provider, MD  loperamide (IMODIUM A-D) 2 MG tablet Take 2 mg by mouth 4 (four) times daily as needed for diarrhea or loose stools.    Historical Provider, MD  LORazepam (ATIVAN) 0.5 MG tablet Take 0.5 mg by mouth at bedtime as needed for anxiety or sleep.  04/16/15   Historical Provider, MD  mirtazapine (REMERON) 15 MG tablet Take 15 mg by mouth at bedtime.    Historical Provider, MD   BP 162/81 mmHg  Pulse 96  Temp(Src) 98.6 F (37 C) (Rectal)  Resp 16  Ht  (1.676 m)  Wt  115 lb (52.164 kg)  BMI 18.57 kg/m2  SpO2 100% Physical Exam  Constitutional: She appears well-developed. She appears distressed (she is lethargic).  Elderly, frail  HENT:  Head: Normocephalic and atraumatic.  Right Ear: External ear normal.  Left Ear: External ear normal.  Eyes: Conjunctivae and EOM are normal. Pupils are equal, round, and reactive to light.  Neck: Normal range of motion and phonation normal. Neck supple.  Cardiovascular: Normal rate, regular rhythm and normal heart sounds.   Pulmonary/Chest: Effort normal and breath sounds normal. She exhibits no bony tenderness.  Abdominal: Soft. There is no tenderness.  Musculoskeletal:  Stiff arms and legs bilaterally  Neurological: No sensory  deficit.  She is nonverbal. She has increased tone in the arms and legs bilaterally. Head is slumped forward with neck flexion.  Skin: Skin is warm, dry and intact.  Psychiatric:  Obtunded  Nursing note and vitals reviewed.   ED Course  Procedures (including critical care time)  Medications  sodium chloride 0.9 % bolus 500 mL (0 mLs Intravenous Stopped 02/09/16 1726)    Patient Vitals for the past 24 hrs:  BP Temp Temp src Pulse Resp SpO2 Height Weight  02/09/16 1830 162/81 mmHg - - - - - - -  02/09/16 1800 146/85 mmHg - - - - - - -  02/09/16 1730 169/90 mmHg - - - - - - -  02/09/16 1700 169/89 mmHg - - 96 - 100 % - -  02/09/16 1621 - 98.6 F (37 C) Rectal - - - - -  02/09/16 1525 144/88 mmHg 99.1 F (37.3 C) Tympanic 95 16 100 %  (1.676 m) 115 lb (52.164 kg)    6:39 PM Reevaluation with update and discussion. After initial assessment and treatment, an updated evaluation reveals oral fluid trial, patient was unable to drink anything. Nursing were unable to get her to even attempt to drink. Findings discussed with patient's son, and her husband, all questions were answered. Jackie Littlejohn L   6:42 PM-Consult complete with Hospitalist patitient case explained and discussed. He agrees to admit patient for further evaluation and treatment. Call ended at 19:05  Labs Review Labs Reviewed  URINALYSIS, ROUTINE W REFLEX MICROSCOPIC (NOT AT [facility]) - Abnormal; Notable for the following:    Specific Gravity, Urine <1.005 (*)    Hgb urine dipstick TRACE (*)    Leukocytes, UA TRACE (*)    All other components within normal limits  BASIC METABOLIC PANEL - Abnormal; Notable for the following:    Glucose, Bld 126 (*)    BUN 21 (*)    Creatinine, Ser 1.20 (*)    GFR calc non Af Amer 44 (*)    GFR calc Af Amer 51 (*)    All other components within normal limits  CBC WITH DIFFERENTIAL/PLATELET - Abnormal; Notable for the following:    Hemoglobin 11.4 (*)    HCT 34.0 (*)    All other  components within normal limits  URINE MICROSCOPIC-ADD ON - Abnormal; Notable for the following:    Squamous Epithelial / LPF 0-5 (*)    Bacteria, UA FEW (*)    All other components within normal limits  URINE CULTURE  AMMONIA  CBG MONITORING, ED    Imaging Review No results found. I have personally reviewed and evaluated these images and lab results as part of my medical decision-making.   EKG Interpretation None      MDM   Final diagnoses:  Altered mental status, unspecified altered mental status  type    Patient with reported dementia, felt to be "end-stage", with recent increasing dose of Remeron, and now lethargy. No clear metabolic or infectious process. Patient did seem to improve somewhat with IV fluids, and may be mildly dehydrated. Since she is unable to eat or drink. She will require admission for monitoring, parenteral fluids, and further treatment as indicated.  Nursing Notes Reviewed/ Care Coordinated, and agree without changes. Applicable Imaging Reviewed.  Interpretation of Laboratory Data incorporated into ED treatment  Plan: Admit    Mancel BaleElliott Jaxxon Naeem, MD 02/09/16 1909

## 2016-02-09 NOTE — ED Notes (Signed)
Patient brought in by family with complaint of not eating x 2 days. States she is from [facility] and "the doctor has been increasing her lipitor and I think it's because of the medicine." Patient has alzheimer's and will not answer questions. Family member states this is normal behavior for patient.

## 2016-02-09 NOTE — ED Notes (Addendum)
Pt is awake and looking around room.  Confused at baseline, no worse than normal per son.  Was unable to eat or drink.  Does not seem to comprehend to put into mouth and swallow.

## 2016-02-09 NOTE — H&P (Addendum)
[facility] History and Physical  Chelsea Robinson XEN:[id number] DOB: August 19, 1946 DOA: 02/09/2016  Referring physician: Dr. Eulis Foster PCP: Stephens Shire, MD   Chief Complaint: Refusal to eat or drink  HPI:  Ms. Chelsea Robinson is 70 year old female with a past medical history significant for diabetes mellitus type 2 without complication, Alzheimer's dementia, hypertension; who was brought in by family who report that the patient is not eating the last 2 days. History is obtained from review of records as patient has dementia and is nonverbal. Per family the patient at baseline will eat with assistance and is usually nonverbal.Reported associated symptoms of patient being more lethargic and sleeping more. To the family's knowledge she had not been coughing or vomiting. At the facility she had been getting physical therapy 2 or 3 days a week per the request of her neurologist. Also reported that patient was recently increased one of her medications to 20 mg.  Upon admission patient is evaluated and seen have elevation and BUN to creatinine ratio of 21:1.2 . Other workup including chest x-ray and urinalysis appear showed no acute signs of infection.  Review of Systems  Unable to perform ROS: dementia    Past Medical History  Diagnosis Date  . Diabetes mellitus without complication ([place])   . Dementia   . Hypertension   . Renal disorder   . Alzheimer disease      History reviewed. No pertinent past surgical history.    Social History:  reports that she has never smoked. She does not have any smokeless tobacco history on file. She reports that she does not drink alcohol or use illicit drugs. Patient lives in a dementia unit at [facility]  No Known Allergies  History reviewed. No pertinent family history.    Prior to Admission medications   Medication Sig Start Date End Date Taking? Authorizing Provider  aspirin 81 MG tablet Take 81 mg by mouth daily.   Yes Historical Provider, MD   atorvastatin (LIPITOR) 10 MG tablet Take 10 mg by mouth at bedtime.   Yes Historical Provider, MD  cholecalciferol (VITAMIN D) 1000 units tablet Take 2,000 Units by mouth daily.   Yes Historical Provider, MD  glimepiride (AMARYL) 2 MG tablet Take 1 mg by mouth daily with breakfast.   Yes Historical Provider, MD  hydrochlorothiazide (HYDRODIURIL) 12.5 MG tablet Take 12.5 mg by mouth daily.   Yes Historical Provider, MD  megestrol (MEGACE) 40 MG/ML suspension Take 400 mg by mouth 2 (two) times daily.   Yes Historical Provider, MD  memantine (NAMENDA) 10 MG tablet Take 20 mg by mouth daily.   Yes Historical Provider, MD  metFORMIN (GLUCOPHAGE) 500 MG tablet Take 500 mg by mouth 2 (two) times daily with a meal.   Yes Historical Provider, MD  polyethylene glycol (MIRALAX / GLYCOLAX) packet Take 17 g by mouth daily.   Yes Historical Provider, MD  loperamide (IMODIUM A-D) 2 MG tablet Take 2 mg by mouth 4 (four) times daily as needed for diarrhea or loose stools.    Historical Provider, MD  LORazepam (ATIVAN) 0.5 MG tablet Take 0.5 mg by mouth at bedtime as needed for anxiety or sleep.  04/16/15   Historical Provider, MD  mirtazapine (REMERON) 15 MG tablet Take 15 mg by mouth at bedtime.    Historical Provider, MD     Physical Exam: Filed Vitals:   02/09/16 1700 02/09/16 1730 02/09/16 1800 02/09/16 1830  BP: 169/89 169/90 146/85 162/81  Pulse: 96     Temp:  TempSrc:      Resp:      Height:      Weight:      SpO2: 100%        Constitutional: Vital signs reviewed. Patient  thin and frail elderly female who appears awake but has her eyes closed. Patient will not respond to commands.  Head: Normocephalic and atraumatic  Ear: TM normal bilaterally  Mouth: no erythema or exudates, MMM  Eyes: PERRL, EOMI, conjunctivae normal, No scleral icterus.  Neck: flexed Trachea midline normal ROM, No JVD, mass, thyromegaly, or carotid bruit present.  Cardiovascular: RRR, S1 normal, S2 normal, no MRG,  pulses symmetric and intact bilaterally  Pulmonary/Chest: CTAB, no wheezes, rales, or rhonchi  Abdominal: Soft. Non-tender, non-distended, bowel sounds are normal, no masses, organomegaly, or guarding present.  GU: no CVA tenderness Musculoskeletal:no joint deformities or erythema. Extremities appear very stiff and rigid. Ext: no edema and no cyanosis, pulses palpable bilaterally (DP and PT)  Hematology: no cervical, inginal, or axillary adenopathy.  Neurological: moves all extremities to pain Skin: Warm, dry and intact. No rash, cyanosis, or clubbing.  Psychiatric:  will not respond to verbal stimuli and is nonverbal.    Data Review   Micro Results No results found for this or any previous visit (from the past 240 hour(s)).  Radiology Reports No results found.   CBC  Recent Labs Lab 02/09/16 1722  WBC 9.8  HGB 11.4*  HCT 34.0*  PLT 347  MCV 82.5  MCH 27.7  MCHC 33.5  RDW 14.0  LYMPHSABS 2.5  MONOABS 0.9  EOSABS 0.1  BASOSABS 0.0    Chemistries   Recent Labs Lab 02/09/16 1722  NA 135  K 3.8  CL 103  CO2 23  GLUCOSE 126*  BUN 21*  CREATININE 1.20*  CALCIUM 9.2   ------------------------------------------------------------------------------------------------------------------ estimated creatinine clearance is 35.4 mL/min (by C-G formula based on Cr of 1.2). ------------------------------------------------------------------------------------------------------------------ No results for input(s): HGBA1C in the last 72 hours. ------------------------------------------------------------------------------------------------------------------ No results for input(s): CHOL, HDL, LDLCALC, TRIG, CHOLHDL, LDLDIRECT in the last 72 hours. ------------------------------------------------------------------------------------------------------------------ No results for input(s): TSH, T4TOTAL, T3FREE, THYROIDAB in the last 72 hours.  Invalid input(s):  FREET3 ------------------------------------------------------------------------------------------------------------------ No results for input(s): VITAMINB12, FOLATE, FERRITIN, TIBC, IRON, RETICCTPCT in the last 72 hours.  Coagulation profile No results for input(s): INR, PROTIME in the last 168 hours.  No results for input(s): DDIMER in the last 72 hours.  Cardiac Enzymes No results for input(s): CKMB, TROPONINI, MYOGLOBIN in the last 168 hours.  Invalid input(s): CK ------------------------------------------------------------------------------------------------------------------ Invalid input(s): POCBNP   CBG:  Recent Labs Lab 02/09/16 1542  GLUCAP 91     Assessment/Plan Failure to thrive in adult: Acute. Exact cause of patient's symptoms are unknown at this time. Question medication as a possible cause. - Admit to a MedSurg bed - Check ESR, CRP, TSH - Carb modified diet encourage/have staff to feed patient meals - Neuro checks - Continue to monitor  Diabetes mellitus without complication: - Held patient's home glimepride and metformin - CBGs every 4 hours - Hypoglycemic protocols  Alzheimer's Dementia: Patient's Namenda most likely recently increased to 20 mg daily we'll hold for now as question if this is possibly causing some of patient's acute symptoms. - Held patient's Namenda as it appears this is the medication that was recently increased - Continue to monitor  Dehydration: Patient found to have acute elevation in the BUN to creatinine ratio of 23:0.99 on admission. - Gentle IV fluid hydration of normal saline at  75 mL per hour  Renal insufficiency with Chronic kidney disease, stage III (moderate) - Continue IV fluids as seen above - Recheck BMP in a.m.  Essential hypertension - Held patient's hydrochlorothiazide secondary to dehydration,  Evaluate need to restart in a.m.    Hyperlipidemia - Continue atorvastatin        Code Status:   full Family  Communication: bedside Disposition Plan: admit   Total time spent 55 minutes.Greater than 50% of this time was spent in counseling, explanation of diagnosis, planning of further management, and coordination of care  [facility] Pager [phone]  If 7PM-7AM, please contact night-coverage [email or web address] Password [facility] 02/09/2016, 7:05 PM

## 2016-02-10 DIAGNOSIS — N183 Chronic kidney disease, stage 3 (moderate): Secondary | ICD-10-CM | POA: Diagnosis present

## 2016-02-10 DIAGNOSIS — I129 Hypertensive chronic kidney disease with stage 1 through stage 4 chronic kidney disease, or unspecified chronic kidney disease: Secondary | ICD-10-CM | POA: Diagnosis present

## 2016-02-10 DIAGNOSIS — R627 Adult failure to thrive: Secondary | ICD-10-CM | POA: Diagnosis present

## 2016-02-10 DIAGNOSIS — Z515 Encounter for palliative care: Secondary | ICD-10-CM | POA: Diagnosis present

## 2016-02-10 DIAGNOSIS — E1122 Type 2 diabetes mellitus with diabetic chronic kidney disease: Secondary | ICD-10-CM | POA: Diagnosis present

## 2016-02-10 DIAGNOSIS — R4182 Altered mental status, unspecified: Secondary | ICD-10-CM | POA: Diagnosis present

## 2016-02-10 DIAGNOSIS — F0390 Unspecified dementia without behavioral disturbance: Secondary | ICD-10-CM | POA: Diagnosis not present

## 2016-02-10 DIAGNOSIS — G309 Alzheimer's disease, unspecified: Secondary | ICD-10-CM | POA: Diagnosis present

## 2016-02-10 DIAGNOSIS — E860 Dehydration: Secondary | ICD-10-CM | POA: Diagnosis present

## 2016-02-10 LAB — BASIC METABOLIC PANEL
Anion gap: 7 (ref 5–15)
BUN: 15 mg/dL (ref 6–20)
CALCIUM: 9.1 mg/dL (ref 8.9–10.3)
CO2: 24 mmol/L (ref 22–32)
CREATININE: 1 mg/dL (ref 0.44–1.00)
Chloride: 109 mmol/L (ref 101–111)
GFR, EST NON AFRICAN AMERICAN: 55 mL/min — AB (ref >60)
Glucose, Bld: 106 mg/dL — ABNORMAL HIGH (ref 65–99)
Potassium: 3.4 mmol/L — ABNORMAL LOW (ref 3.5–5.1)
SODIUM: 140 mmol/L (ref 135–145)

## 2016-02-10 LAB — GLUCOSE, CAPILLARY
GLUCOSE-CAPILLARY: 94 mg/dL (ref 65–99)
GLUCOSE-CAPILLARY: 88 mg/dL (ref 65–99)
GLUCOSE-CAPILLARY: 142 mg/dL — AB (ref 65–99)
Glucose-Capillary: 159 mg/dL — ABNORMAL HIGH (ref 65–99)
Glucose-Capillary: 92 mg/dL (ref 65–99)
Glucose-Capillary: 98 mg/dL (ref 65–99)

## 2016-02-10 LAB — CBC
HCT: 32.1 % — ABNORMAL LOW (ref 36.0–46.0)
Hemoglobin: 10.7 g/dL — ABNORMAL LOW (ref 12.0–15.0)
MCH: 27.6 pg (ref 26.0–34.0)
MCHC: 33.3 g/dL (ref 30.0–36.0)
MCV: 82.7 fL (ref 78.0–100.0)
PLATELETS: 336 10*3/uL (ref 150–400)
RBC: 3.88 MIL/uL (ref 3.87–5.11)
RDW: 14 % (ref 11.5–15.5)
WBC: 8.4 10*3/uL (ref 4.0–10.5)

## 2016-02-10 LAB — INFLUENZA PANEL BY PCR (TYPE A & B)
H1N1 flu by pcr: NOT DETECTED
INFLAPCR: NEGATIVE
INFLBPCR: NEGATIVE

## 2016-02-10 LAB — TSH: TSH: 1.58 u[IU]/mL (ref 0.350–4.500)

## 2016-02-10 LAB — C-REACTIVE PROTEIN: CRP: 2.1 mg/dL — AB (ref <1.0)

## 2016-02-10 NOTE — Progress Notes (Signed)
[facility] PROGRESS NOTE  Chelsea Robinson ZOX:[id number] DOB: 03/25/1946 DOA: 02/09/2016 PCP: Delorse LekBURNETT,BRENT A, MD  Assessment/Plan: Adult failure to thrive -Likely simply represents progression of patient's advanced Alzheimer's dementia. -Palliative care consultation has been requested. -Per family report she is non-verbal at baseline.  Dementia -See no need to resume namenda at this point given how advanced her dementia is. -Doubt the increase in dose of namenda is the etiology of her FTT.  Acute on CKD Stage III -Cr is back at baseline.  HTN -Well controlled  Code Status: Full code for now Family Communication: None at bedside  Disposition Plan: to be determined pending palliative care consultation   Consultants:  None   Antibiotics:  None   Subjective: Lying in bed, non-communicative, fixed stare  Objective: Filed Vitals:   02/09/16 2031 02/10/16 0416 02/10/16 1335 02/10/16 1524  BP: 156/71 99/72 109/62   Pulse: 96 90 77   Temp: 98.4 F (36.9 C) 98.6 F (37 C) 97.9 F (36.6 C)   TempSrc: Axillary Axillary Axillary   Resp: 20 20 16    Height: 5\' 6"  (1.676 m)     Weight: 52.254 kg (115 lb 3.2 oz)     SpO2: 100% 100% 100% 97%    Intake/Output Summary (Last 24 hours) at 02/10/16 1525 Last data filed at 02/10/16 1400  Gross per 24 hour  Intake     75 ml  Output      0 ml  Net     75 ml   Filed Weights   02/09/16 1525 02/09/16 2031  Weight: 52.164 kg (115 lb) 52.254 kg (115 lb 3.2 oz)    Exam:   General:  AA Ox3  Cardiovascular: RRR  Respiratory: CTA B  Abdomen: S/NT/ND/+BS  Extremities: trace bilateral edema   Neurologic:  Unable to assess given current mental state  Data Reviewed: Basic Metabolic Panel:  Recent Labs Lab 02/09/16 1722 02/10/16 0620  NA 135 140  K 3.8 3.4*  CL 103 109  CO2 23 24  GLUCOSE 126* 106*  BUN 21* 15  CREATININE 1.20* 1.00  CALCIUM 9.2 9.1   Liver Function Tests: No results for input(s):  AST, ALT, ALKPHOS, BILITOT, PROT, ALBUMIN in the last 168 hours. No results for input(s): LIPASE, AMYLASE in the last 168 hours.  Recent Labs Lab 02/09/16 1722  AMMONIA 12   CBC:  Recent Labs Lab 02/09/16 1722 02/10/16 0620  WBC 9.8 8.4  NEUTROABS 6.4  --   HGB 11.4* 10.7*  HCT 34.0* 32.1*  MCV 82.5 82.7  PLT 347 336   Cardiac Enzymes: No results for input(s): CKTOTAL, CKMB, CKMBINDEX, TROPONINI in the last 168 hours. BNP (last 3 results) No results for input(s): BNP in the last 8760 hours.  ProBNP (last 3 results) No results for input(s): PROBNP in the last 8760 hours.  CBG:  Recent Labs Lab 02/09/16 2059 02/10/16 0047 02/10/16 0417 02/10/16 0814 02/10/16 1132  GLUCAP 99 98 94 88 92    Recent Results (from the past 240 hour(s))  Urine culture     Status: None (Preliminary result)   Collection Time: 02/09/16  4:20 PM  Result Value Ref Range Status   Specimen Description URINE, CATHETERIZED  Final   Special Requests Normal  Final   Culture   Final    NO GROWTH < 24 HOURS [facility]    Report Status PENDING  Incomplete     Studies: Dg Chest Port 1 View  02/09/2016  CLINICAL DATA:  Pre admission EXAM: PORTABLE CHEST 1 VIEW COMPARISON:  07/22/2015 FINDINGS: Heart is normal size. No confluent airspace opacities or effusions. No acute bony abnormality. IMPRESSION: No active disease. Electronically Signed   By: Charlett Nose M.D.   On: 02/09/2016 19:25    Scheduled Meds: . aspirin  81 mg Oral Daily  . atorvastatin  10 mg Oral QHS  . cholecalciferol  2,000 Units Oral Daily  . enoxaparin (LOVENOX) injection  40 mg Subcutaneous Q24H  . insulin aspart  0-9 Units Subcutaneous 6 times per day  . megestrol  400 mg Oral BID   Continuous Infusions: . sodium chloride 75 mL/hr at 02/10/16 1300    Principal Problem:   Failure to thrive in adult Active Problems:   Diabetes mellitus without complication (HCC)   Dementia   Dehydration    Chronic kidney disease, stage III (moderate)   Altered mental state    Time spent: 25 minutes. Greater than 50% of this time was spent in direct contact with the patient coordinating care.    Chaya Jan  [facility] Pager [phone]  If 7PM-7AM, please contact night-coverage at [email or web address], password [facility] 02/10/2016, 3:25 PM  LOS: 0 days

## 2016-02-11 ENCOUNTER — Encounter (HOSPITAL_COMMUNITY): Payer: Self-pay | Admitting: Primary Care

## 2016-02-11 DIAGNOSIS — Z515 Encounter for palliative care: Secondary | ICD-10-CM | POA: Insufficient documentation

## 2016-02-11 DIAGNOSIS — Z7189 Other specified counseling: Secondary | ICD-10-CM | POA: Insufficient documentation

## 2016-02-11 DIAGNOSIS — E860 Dehydration: Secondary | ICD-10-CM

## 2016-02-11 DIAGNOSIS — R627 Adult failure to thrive: Principal | ICD-10-CM

## 2016-02-11 DIAGNOSIS — F0390 Unspecified dementia without behavioral disturbance: Secondary | ICD-10-CM

## 2016-02-11 LAB — GLUCOSE, CAPILLARY
GLUCOSE-CAPILLARY: 110 mg/dL — AB (ref 65–99)
GLUCOSE-CAPILLARY: 149 mg/dL — AB (ref 65–99)
GLUCOSE-CAPILLARY: 150 mg/dL — AB (ref 65–99)
GLUCOSE-CAPILLARY: 69 mg/dL (ref 65–99)
Glucose-Capillary: 108 mg/dL — ABNORMAL HIGH (ref 65–99)
Glucose-Capillary: 113 mg/dL — ABNORMAL HIGH (ref 65–99)
Glucose-Capillary: 99 mg/dL (ref 65–99)

## 2016-02-11 LAB — URINE CULTURE
CULTURE: NO GROWTH
SPECIAL REQUESTS: NORMAL

## 2016-02-11 MED ORDER — DEXTROSE 50 % IV SOLN
INTRAVENOUS | Status: AC
  Filled 2016-02-11: qty 50

## 2016-02-11 MED ORDER — DEXTROSE 50 % IV SOLN
1.0000 | Freq: Once | INTRAVENOUS | Status: AC
  Administered 2016-02-11: 50 mL via INTRAVENOUS

## 2016-02-11 NOTE — Progress Notes (Signed)
[facility] PROGRESS NOTE  Chelsea Robinson Troop ZOX:[id number] DOB: May 27, 1946 DOA: 02/09/2016 PCP: Delorse Lek, MD  Assessment/Plan: Adult failure to thrive -Likely simply represents progression of patient's advanced Alzheimer's dementia. -Palliative care consultation has been requested. -Per family report she is non-verbal at baseline.  Dementia -See no need to resume namenda at this point given how advanced her dementia is. -Doubt the increase in dose of namenda is the etiology of her FTT.  Acute on CKD Stage III -Cr is back at baseline.  HTN -Well controlled  Code Status: Full code for now Family Communication: None at bedside  Disposition Plan: to be determined pending palliative care consultation   Consultants:  None   Antibiotics:  None   Subjective: Lying in bed, non-communicative, fixed stare  Objective: Filed Vitals:   02/10/16 1524 02/10/16 2055 02/11/16 0506 02/11/16 1548  BP:  151/76 158/80 136/71  Pulse:  85 74 83  Temp:  99.6 F (37.6 C) 97.8 F (36.6 C) 99.1 F (37.3 C)  TempSrc:  Axillary Oral Oral  Resp:  Height:      Weight:      SpO2: 97% 100% 99% 100%    Intake/Output Summary (Last 24 hours) at 02/11/16 1605 Last data filed at 02/11/16 0930  Gross per 24 hour  Intake     90 ml  Output      0 ml  Net     90 ml   Filed Weights   02/09/16 1525 02/09/16 2031  Weight: 52.164 kg (115 lb) 52.254 kg (115 lb 3.2 oz)    Exam:   General:  Confused, nonverbal  Cardiovascular: RRR  Respiratory: CTA B  Abdomen: S/NT/ND/+BS  Extremities: trace bilateral edema   Neurologic:  Unable to assess given current mental state  Data Reviewed: Basic Metabolic Panel:  Recent Labs Lab 02/09/16 1722 02/10/16 0620  NA 135 140  K 3.8 3.4*  CL 103 109  CO2 23 24  GLUCOSE 126* 106*  BUN 21* 15  CREATININE 1.20* 1.00  CALCIUM 9.2 9.1   Liver Function Tests: No results for input(s): AST, ALT, ALKPHOS, BILITOT, PROT,  ALBUMIN in the last 168 hours. No results for input(s): LIPASE, AMYLASE in the last 168 hours.  Recent Labs Lab 02/09/16 1722  AMMONIA 12   CBC:  Recent Labs Lab 02/09/16 1722 02/10/16 0620  WBC 9.8 8.4  NEUTROABS 6.4  --   HGB 11.4* 10.7*  HCT 34.0* 32.1*  MCV 82.5 82.7  PLT 347 336   Cardiac Enzymes: No results for input(s): CKTOTAL, CKMB, CKMBINDEX, TROPONINI in the last 168 hours. BNP (last 3 results) No results for input(s): BNP in the last 8760 hours.  ProBNP (last 3 results) No results for input(s): PROBNP in the last 8760 hours.  CBG:  Recent Labs Lab 02/10/16 2053 02/11/16 0038 02/11/16 0456 02/11/16 0739 02/11/16 1148  GLUCAP 142* 110* 108* 113* 99    Recent Results (from the past 240 hour(s))  Urine culture     Status: None   Collection Time: 02/09/16  4:20 PM  Result Value Ref Range Status   Specimen Description URINE, CATHETERIZED  Final   Special Requests Normal  Final   Culture   Final    NO GROWTH 2 DAYS Performed at [facility]    Report Status 02/11/2016 FINAL  Final     Studies: Dg Chest Port 1 View  02/09/2016  CLINICAL DATA:  Pre admission EXAM: PORTABLE CHEST 1  VIEW COMPARISON:  07/22/2015 FINDINGS: Heart is normal size. No confluent airspace opacities or effusions. No acute bony abnormality. IMPRESSION: No active disease. Electronically Signed   By: Charlett NoseKevin  Dover M.D.   On: 02/09/2016 19:25    Scheduled Meds: . aspirin  81 mg Oral Daily  . atorvastatin  10 mg Oral QHS  . cholecalciferol  2,000 Units Oral Daily  . enoxaparin (LOVENOX) injection  40 mg Subcutaneous Q24H  . insulin aspart  0-9 Units Subcutaneous 6 times per day  . megestrol  400 mg Oral BID   Continuous Infusions: . sodium chloride 75 mL/hr at 02/11/16 1438    Principal Problem:   Failure to thrive in adult Active Problems:   Diabetes mellitus without complication (HCC)   Dementia   Dehydration   Chronic kidney disease, stage III (moderate)    Altered mental state    Time spent: 25 minutes. Greater than 50% of this time was spent in direct contact with the patient coordinating care.    Chaya JanHERNANDEZ ACOSTA,ESTELA  [facility] Pager [phone]  If 7PM-7AM, please contact night-coverage at [email or web address], password [email or web address] 02/11/2016, 4:05 PM  LOS: 1 day

## 2016-02-11 NOTE — Progress Notes (Signed)
LCSW is aware of consult and pending palliative care consult requested. Will await review of GOC with palliative and follow acutely for needs from CSW department.  Deretha EmoryHannah Ellysia Char LCSW, MSW

## 2016-02-11 NOTE — Consult Note (Signed)
Consultation Note Date: 02/11/2016   Patient Name: Chelsea Robinson  DOB: 1946/02/27  MRN: [id number]  Age / Sex: 70 y.o., female  PCP: Delorse Lek, MD Referring Physician: Micael Hampshire Acost*  Reason for Consultation: Disposition, Establishing goals of care and Psychosocial/spiritual support    Clinical Assessment/Narrative: Chelsea Robinson is a 70 year old female with past medical history of dementia (advanced stages), diabetes, and hypertension who lives in [address] assisted living facility since July 2016.  Per her daughter, Marchelle Folks, she is mostly wheelchair-bound, has urinary and fecal incontinence, but is able to feed herself with set up.   She was brought to the hospital on March 11 because she had not been eating for the last several days.  Chelsea Robinson is resting quietly in bed. She does not make eye contact with me or try to communicate.  I offer her chocolate ice cream and she opens her mouth and takes this easily.  She coughs and expels some of the ice cream, she uses her right hand to pick up the ice cream and place it in her mouth.  Call to daughter Dorathy Daft. We talk about Chelsea Robinson' chronic and acute health problems. Marchelle Folks shares that she feels like this change in condition (not being able to feed herself) is caused by the recent re-addition of Namenda at a rate higher than she has taken (from 7 mg to 20 mg).  She said this was restarted and increased in an attempt to improve Chelsea Robinson alertness and activity level.   We talk about expected changes with dementia including changes in mobility and eating patterns. Marchelle Folks states that the the family notices when she has a change; they know when she is not eating and becomes dehydrated.  We talk about her diet and Marchelle Folks states that until last week Chelsea Robinson was able to feed herself.  We discussed starting a pured diet at this time, advancing to  mechanical soft as tolerated Marchelle Folks agrees).  When she is capable, Chelsea Robinson would benefit from a speech therapy evaluation.  We talk about the changes that occur with eating including aspiration. Marchelle Folks shares that her father had that problem during his last illness. We discussed peg tube placement, and that it is not recommended for people with dementia.   Marchelle Folks shares that her mother did not have Advanced Directives and never shared what she did and did not want.   We talk about the realities of CPR.   At this point Amanda's desire is for her mother to be full code.  We discussed reevaluating code status after every illness, hospitalization, or decline. We discuss peg tube placement.  We discussed the concepts of allow a natural death, do not treat the next infection, do not rehospitalize. I reassure Marchelle Folks that, keeping Chelsea Robinson at the center, we will do what the family feels is right for them. Marchelle Folks shares that she leans on Chelsea Robinson' sister who "is an LPN or CNA".  I asked Marchelle Folks if any of her providers had shared the chronic illness trajectory for dementia. She said the neurologist shared that [name] was in the "advanced stages".  That she was expected to lose the ability to walk and to feed herself.  She states, "she is in [facility] now, she has lost that already".  I describe the chronic illness trajectory.  Marchelle Folks shares that she brings Chelsea Robinson to her home in [place] every weekend and make sure she walks some.  I ask  if she is seen a functional decline during the time that her mother has been in the assisted living facility (July 2016), and [name] shares that she has seen a declilne.   She tells me that they would like for their mother to return to her home, but they worry that their father would not realize the extent of her illness and place too many dependent demands on her.  She shares that her goal is for her [facility]. She is aware  that her mother may not be able to stay in this facility if she is not able to feed herself. She shares that she is now searching for a skilled nursing home, considering [facility].  Contacts/Participants in Discussion:  Ms. Lennie OdorScales is unable to participate in discussions. Call to daughter Dorathy Daftmanda White for goals of care discussion. Primary Decision Maker: Dorathy DaftAmanda White, daughter. Relationship to Patient daughter HCPOA: no [name] shares that her father Fayrene FearingJames, is unable to make health care decisions for Chelsea Robinson. She states, "he would just look at me".  She and her brother Trey PaulaJeff make decisions for their mother. They also have another younger brother named Joselyn Glassmanyler.  SUMMARY OF RECOMMENDATIONS  Code Status/Advance Care Planning: Full code- we discussed the realities of CPR including chest compressions (likely to break ribs) cardioversion, and intubation/ventilation.   I encourage [name] to keep Chelsea Robinson at the center of her decisions,and that we will do whatever the family feels like it's best for Chelsea Robinson.  We discuss the concepts of do not re-hospitalize, do not treat the next infection.     Code Status Orders        Start     Ordered   02/09/16 2017  Full code   Continuous     02/09/16 2019    Code Status History    Date Active Date Inactive Code Status Order ID Comments User Context   05/02/2015  3:59 PM 05/04/2015  8:26 PM Full Code [id number]  Gwenyth BenderKaren M Black, NP Inpatient      Other Directives:None  [name] speaks about completing "guardianship papers" for [name]. I encouraged her to work with [facility] [place] and their Child psychotherapistsocial worker. I share with her several times that the hospital social worker cannot help her with this paperwork  Symptom Management:   Per hospitalist  Palliative Prophylaxis:   Turn Reposition  Additional Recommendations (Limitations, Scope, Preferences):  Full Scope Treatment  Psycho-social/Spiritual:  Support System: Strong Desire for further  Chaplaincy support:not discussed today Additional Recommendations: Education on Hospice  Prognosis: Unable to determine, likely 12 months or less due to advanced dementia  Discharge Planning: daughter [name] states her goal is to return Chelsea Robinson to [facility] in [place]. She states she realizes that if Chelsea Robinson is unable to feed herself she will not be able to stay at [facility]. She states she is considering [facility] in [place] for long-term care placement.   Chief Complaint/ Primary Diagnoses: Present on Admission:  . Failure to thrive in adult . Dehydration . Chronic kidney disease, stage III (moderate) . Dementia . Altered mental state  I have reviewed the medical record, interviewed the patient and family, and examined the patient. The following aspects are pertinent.  Past Medical History  Diagnosis Date  . Diabetes mellitus without complication (HCC)   . Dementia   . Hypertension   . Renal disorder   . Alzheimer disease    Social History   Social History  .  Marital Status: Married    Spouse Name: N/A  . Number of Children: N/A  . Years of Education: N/A   Social History Main Topics  . Smoking status: Never Smoker   . Smokeless tobacco: None  . Alcohol Use: No  . Drug Use: No  . Sexual Activity: Not Asked   Other Topics Concern  . None   Social History Narrative   History reviewed. No pertinent family history. Scheduled Meds: . aspirin  81 mg Oral Daily  . atorvastatin  10 mg Oral QHS  . cholecalciferol  2,000 Units Oral Daily  . enoxaparin (LOVENOX) injection  40 mg Subcutaneous Q24H  . insulin aspart  0-9 Units Subcutaneous 6 times per day  . megestrol  400 mg Oral BID   Continuous Infusions: . sodium chloride 75 mL/hr at 02/11/16 1438   PRN Meds:.acetaminophen **OR** acetaminophen, levalbuterol, loperamide, LORazepam, ondansetron **OR** ondansetron (ZOFRAN) IV, polyethylene glycol Medications Prior to Admission:  Prior to Admission  medications   Medication Sig Start Date End Date Taking? Authorizing Provider  aspirin 81 MG tablet Take 81 mg by mouth daily.   Yes Historical Provider, MD  atorvastatin (LIPITOR) 10 MG tablet Take 10 mg by mouth at bedtime.   Yes Historical Provider, MD  cholecalciferol (VITAMIN D) 1000 units tablet Take 2,000 Units by mouth daily.   Yes Historical Provider, MD  glimepiride (AMARYL) 2 MG tablet Take 1 mg by mouth daily with breakfast.   Yes Historical Provider, MD  hydrochlorothiazide (HYDRODIURIL) 12.5 MG tablet Take 12.5 mg by mouth daily.   Yes Historical Provider, MD  megestrol (MEGACE) 40 MG/ML suspension Take 400 mg by mouth 2 (two) times daily.   Yes Historical Provider, MD  memantine (NAMENDA) 10 MG tablet Take 20 mg by mouth daily.   Yes Historical Provider, MD  metFORMIN (GLUCOPHAGE) 500 MG tablet Take 500 mg by mouth 2 (two) times daily with a meal.   Yes Historical Provider, MD  polyethylene glycol (MIRALAX / GLYCOLAX) packet Take 17 g by mouth daily.   Yes Historical Provider, MD  loperamide (IMODIUM A-D) 2 MG tablet Take 2 mg by mouth 4 (four) times daily as needed for diarrhea or loose stools.    Historical Provider, MD  LORazepam (ATIVAN) 0.5 MG tablet Take 0.5 mg by mouth at bedtime as needed for anxiety or sleep.  04/16/15   Historical Provider, MD  mirtazapine (REMERON) 15 MG tablet Take 15 mg by mouth at bedtime.    Historical Provider, MD   No Known Allergies  Review of Systems  Unable to perform ROS: Dementia    Physical Exam  Nursing note and vitals reviewed. Constitutional: No distress.  HENT:  Head: Normocephalic and atraumatic.  Cardiovascular: Normal rate.   Respiratory: Effort normal. No respiratory distress.  GI: Soft. She exhibits no distension. There is no guarding.  Neurological: She is alert.  Nonverbal, unable to determine orientation.  Skin: Skin is warm and dry.    Vital Signs: BP 158/80 mmHg  Pulse 74  Temp(Src) 97.8 F (36.6 C) (Oral)  Resp  16  Ht 5\' 6"  (1.676 m)  Wt 52.254 kg (115 lb 3.2 oz)  BMI 18.60 kg/m2  SpO2 99%  SpO2: SpO2: 99 % O2 Device:SpO2: 99 % O2 Flow Rate: .   IO: Intake/output summary:  Intake/Output Summary (Last 24 hours) at 02/11/16 1542 Last data filed at 02/11/16 0930  Gross per 24 hour  Intake     90 ml  Output  0 ml  Net     90 ml    LBM:   Baseline Weight: Weight: 52.164 kg (115 lb) Most recent weight: Weight: 52.254 kg (115 lb 3.2 oz)      Palliative Assessment/Data:  Flowsheet Rows        Most Recent Value   Intake Tab    Referral Department  Hospitalist   Unit at Time of Referral  Med/Surg Unit   Palliative Care Primary Diagnosis  Other (Comment) [FTT]   Date Notified  02/10/16   Palliative Care Type  New Palliative care   Reason for referral  Clarify Goals of Care   Date of Admission  02/09/16   Date first seen by Palliative Care  02/11/16   # of days Palliative referral response time  1 Day(s)   # of days IP prior to Palliative referral  1   Clinical Assessment    Palliative Performance Scale Score  20%   Pain Max last 24 hours  Not able to report   Pain Min Last 24 hours  Not able to report   Dyspnea Max Last 24 Hours  Not able to report   Dyspnea Min Last 24 hours  Not able to report   Psychosocial & Spiritual Assessment    Social Work Plan of Care  Staff support   Palliative Care Outcomes    Patient/Family meeting held?  Yes   Who was at the meeting?  daughter Marchelle Folks via phone   Palliative Care Outcomes  Clarified goals of care, Provided advance care planning, Provided psychosocial or spiritual support   Palliative Care follow-up planned  -- [follow up at [facility]]      Additional Data Reviewed:  CBC:    Component Value Date/Time   WBC 8.4 02/10/2016 0620   HGB 10.7* 02/10/2016 0620   HCT 32.1* 02/10/2016 0620   PLT 336 02/10/2016 0620   MCV 82.7 02/10/2016 0620   NEUTROABS 6.4 02/09/2016 1722   LYMPHSABS 2.5 02/09/2016 1722   MONOABS 0.9 02/09/2016 1722    EOSABS 0.1 02/09/2016 1722   BASOSABS 0.0 02/09/2016 1722   Comprehensive Metabolic Panel:    Component Value Date/Time   NA 140 02/10/2016 0620   K 3.4* 02/10/2016 0620   CL 109 02/10/2016 0620   CO2 24 02/10/2016 0620   BUN 15 02/10/2016 0620   CREATININE 1.00 02/10/2016 0620   GLUCOSE 106* 02/10/2016 0620   CALCIUM 9.1 02/10/2016 0620   AST 17 07/22/2015 1735   ALT 20 07/22/2015 1735   ALKPHOS 50 07/22/2015 1735   BILITOT 1.0 07/22/2015 1735   PROT 7.4 07/22/2015 1735   ALBUMIN 3.8 07/22/2015 1735     Time In: 1400 Time Out: 1530 Time Total: 90 minutes Greater than 50%  of this time was spent counseling and coordinating care related to the above assessment and plan.  Signed by: Katheran Awe, NP  Katheran Awe, NP  02/11/2016, 3:42 PM  Please contact Palliative Medicine Team phone at [phone] for questions and concerns.

## 2016-02-12 DIAGNOSIS — N183 Chronic kidney disease, stage 3 (moderate): Secondary | ICD-10-CM

## 2016-02-12 LAB — GLUCOSE, CAPILLARY
GLUCOSE-CAPILLARY: 97 mg/dL (ref 65–99)
GLUCOSE-CAPILLARY: 138 mg/dL — AB (ref 65–99)
Glucose-Capillary: 189 mg/dL — ABNORMAL HIGH (ref 65–99)
Glucose-Capillary: 162 mg/dL — ABNORMAL HIGH (ref 65–99)

## 2016-02-12 NOTE — Progress Notes (Signed)
[facility] with son

## 2016-02-12 NOTE — NC FL2 (Addendum)
[place] MEDICAID FL2 LEVEL OF CARE SCREENING TOOL     IDENTIFICATION  Patient Name: Chelsea Robinson Birthdate: September 11, 1946 Sex: female Admission Date (Current Location): 02/09/2016  [facility] and [place] Number:  [employer] and Address:  [facility],  [address] [place] [address]      Provider Number: [id number]  Attending Physician Name and Address:  Micael Hampshire Acost*  Relative Name and Phone Number:   Dorathy Daft (daughter)    Current Level of Care: Hospital Recommended Level of Care: Skilled Nursing Facility Prior Approval Number:    Date Approved/Denied:   PASRR Number:   [id number]   Discharge Plan: SNF    Current Diagnoses: Patient Active Problem List   Diagnosis Date Noted  . Palliative care encounter   . Advanced care planning/counseling discussion   . Failure to thrive in adult 02/09/2016  . Dehydration 02/09/2016  . Chronic kidney disease, stage III (moderate) 02/09/2016  . Altered mental state 02/09/2016  . Arterial hypotension   . Diarrhea 05/02/2015  . Acute encephalopathy 05/02/2015  . Hypokalemia 05/02/2015  . Diabetes mellitus without complication (HCC)   . Dementia   . Renal disorder   . Essential hypertension 01/28/2008  . INTERNAL HEMORRHOIDS 01/28/2008  . EXTERNAL HEMORRHOIDS 01/28/2008  . CONSTIPATION, CHRONIC 01/28/2008  . HEMOCCULT POSITIVE STOOL 01/28/2008  . OSTEOARTHRITIS 01/28/2008    Orientation RESPIRATION BLADDER Height & Weight     Self  Normal Incontinent Weight: 115 lb 3.2 oz (52.254 kg) Height:   (167.6 cm)  BEHAVIORAL SYMPTOMS/MOOD NEUROLOGICAL BOWEL NUTRITION STATUS  Other (Comment) (no behaviors)   Incontinent Diet (DYS 1, liquids thin)  AMBULATORY STATUS COMMUNICATION OF NEEDS Skin   Extensive Assist Does not communicate Normal                       Personal Care Assistance Level of Assistance  Bathing, Feeding, Dressing Bathing Assistance: Maximum assistance Feeding  assistance: Maximum assistance Dressing Assistance: Maximum assistance     Functional Limitations Info  Sight, Hearing, Speech Sight Info: Impaired Hearing Info: Impaired Speech Info: Impaired (nonverbal)    SPECIAL CARE FACTORS FREQUENCY                       Contractures Contractures Info: Not present    Additional Factors Info  Code Status, Allergies, Psychotropic, Insulin Sliding Scale, Isolation Precautions Code Status Info: Full Code Allergies Info: NKA Psychotropic Info: none noted Insulin Sliding Scale Info: 6x daily Isolation Precautions Info: none     Current Medications (02/12/2016):  This is the current hospital active medication list Current Facility-Administered Medications  Medication Dose Route Frequency Provider Last Rate Last Dose  . 0.9 %  sodium chloride infusion   Intravenous Continuous Clydie Braun, MD 75 mL/hr at 02/11/16 2329    . acetaminophen (TYLENOL) tablet 650 mg  650 mg Oral Q6H PRN Clydie Braun, MD       Or  . acetaminophen (TYLENOL) suppository 650 mg  650 mg Rectal Q6H PRN Clydie Braun, MD      . aspirin chewable tablet 81 mg  81 mg Oral Daily Clydie Braun, MD   81 mg at 02/12/16 1049  . atorvastatin (LIPITOR) tablet 10 mg  10 mg Oral QHS Clydie Braun, MD   10 mg at 02/11/16 2322  . cholecalciferol (VITAMIN D) tablet 2,000 Units  2,000 Units Oral Daily Clydie Braun, MD  2,000 Units at 02/12/16 1047  . dextrose 50 % solution           . enoxaparin (LOVENOX) injection 40 mg  40 mg Subcutaneous Q24H Clydie Braunondell A Smith, MD   40 mg at 02/11/16 2322  . insulin aspart (novoLOG) injection 0-9 Units  0-9 Units Subcutaneous 6 times per day Clydie Braunondell A Smith, MD   2 Units at 02/11/16 1818  . levalbuterol (XOPENEX) nebulizer solution 0.63 mg  0.63 mg Nebulization Q6H PRN Clydie Braunondell A Smith, MD      . loperamide (IMODIUM) capsule 2 mg  2 mg Oral QID PRN Clydie Braunondell A Smith, MD      . LORazepam (ATIVAN) tablet 0.5 mg  0.5 mg Oral QHS PRN Clydie Braunondell  A Smith, MD      . megestrol (MEGACE) 400 MG/10ML suspension 400 mg  400 mg Oral BID Clydie Braunondell A Smith, MD   400 mg at 02/12/16 1050  . ondansetron (ZOFRAN) tablet 4 mg  4 mg Oral Q6H PRN Clydie Braunondell A Smith, MD       Or  . ondansetron (ZOFRAN) injection 4 mg  4 mg Intravenous Q6H PRN Rondell A Smith, MD      . polyethylene glycol (MIRALAX / GLYCOLAX) packet 17 g  17 g Oral Daily PRN Clydie Braunondell A Smith, MD         Discharge Medications: Please see discharge summary for a list of discharge medications.  Relevant Imaging Results:  Relevant Lab Results:   Additional Information SS Number:  [id number]  Raye SorrowCoble, Hannah N, LCSW

## 2016-02-12 NOTE — Progress Notes (Signed)
LCSW spoke with MD and treatment team with regards to patient's disposition. Message left for daughter Dorathy Daftmanda White.  Awaiting call back from daughter to discuss DC plans SNF vs ALF.  Deretha EmoryHannah Sahand Gosch LCSW, MSW

## 2016-02-12 NOTE — Progress Notes (Signed)
LCSW spoke with [facility] who is in agreement with patient returning, reviewed clinical information and ready for her discharge.  Call placed to son: Trey PaulaJeff [phone] who reports he will be here this afternoon to pick patient up for discharge.  RN made aware of DC and family using POV to transport.  No other needs. Patient to DC back to current level of care facility: ALF.  Deretha EmoryHannah Massimo Hartland LCSW, MSW Clinical Social Work: Emergency Room [phone]

## 2016-02-12 NOTE — Clinical Social Work Note (Signed)
Clinical Social Work Assessment  Patient Details  Name: Chelsea Robinson MRN: [id number] Date of Birth: 02/13/1946  Date of referral:  02/12/16               Reason for consult:  Facility Placement (from ALF: [facility])                Permission sought to share information with:  Case Production designer, theatre/television/filmManager, Magazine features editoracility Contact Representative, Family Supports Permission granted to share information::  Yes, Verbal Permission Granted  Name::        Agency::  [facility]  Relationship::  daughter Mickeal Skinnermanda White  Contact Information:     Housing/Transportation Living arrangements for the past 2 months:  [facility] Source of Information:  Medical Team, Facility, Adult Children Patient Interpreter Needed:  None Criminal Activity/Legal Involvement Pertinent to Current Situation/Hospitalization:  No - Comment as needed Significant Relationships:  Adult Children, Other Family Members, Community Support Lives with:  Facility Resident Do you feel safe going back to the place where you live?  Yes Need for family participation in patient care:  Yes (Comment) (due to cognitive problems)  Care giving concerns:  No concerns at this time.  Daughter requests that patient return to ALF at DC.  She has spoken to ALF and ALF feels like they are able to manage her current conditions.   Social Worker assessment / plan:  LCSW spoke with daughter via phone as no family in room to complete assessment.  Patient unable to complete assessment due to verbal and cognitive function level. Daughter given options as presented by MD and pallative care team. Daughter reports she wants her to return to ALF at DC. Aware of DC today due to no other interventions needed at this time. Daughter reports patient will be picked up by her son and daughter will meet her at the facility.  Employment status:  Retired Engineer, miningnsurance information:  Medicare, Managed Care PT Recommendations:  Not assessed at this time Information / Referral  to community resources:  Skilled Nursing Facility  Patient/Family's Response to care:  Agreeable to plan  Patient/Family's Understanding of and Emotional Response to Diagnosis, Current Treatment, and Prognosis:  Daughter very optimistic and hopeful patient will return to baseline once she returns to her previous situation/living.  Reports with family and familiarity she feels patient will benefit.  Daughter is optimistic, but also in denial regarding patient prognosis.    Emotional Assessment Appearance:  Appears older than stated age Attitude/Demeanor/Rapport:  Lethargic, Other (nonverbal, quiet) Affect (typically observed):  Stoic Orientation:  Oriented to Self Alcohol / Substance use:  Not Applicable Psych involvement (Current and /or in the community):  No (Comment)  Discharge Needs  Concerns to be addressed:  No discharge needs identified Readmission within the last 30 days:  No Current discharge risk:  None Barriers to Discharge:  No Barriers Identified   Raye SorrowCoble, Ayad Nieman N, LCSW 02/12/2016, 11:33 AM

## 2016-02-12 NOTE — Care Management Important Message (Signed)
Important Message  Patient Details  Name: Chelsea Robinson MRN: [id number] Date of Birth: 04/16/1946   Medicare Important Message Given:  Yes    Adonis HugueninBerkhead, Hadasah Brugger L, RN 02/12/2016, 2:04 PM

## 2016-02-12 NOTE — Discharge Summary (Signed)
Physician Discharge Summary  Chelsea Robinson ZOX:[id number] DOB: [birth year] DOA: 02/09/2016  PCP: Chelsea Lek, Robinson  Admit date: 02/09/2016 Discharge date: 02/12/2016  Time spent: 45 minutes  Recommendations for Outpatient Follow-up:  -Will be discharged back to ALF today. -Would recommend continued palliative care follow up at ALF to further conversation about goals of care.   Discharge Diagnoses:  Principal Problem:   Failure to thrive in adult Active Problems:   Diabetes mellitus without complication (HCC)   Dementia   Dehydration   Chronic kidney disease, stage III (moderate)   Altered mental state   Palliative care encounter   Advanced care planning/counseling discussion   Discharge Condition: Stable and improved  Filed Weights   02/09/16 1525 02/09/16 2031  Weight: 52.164 kg (115 lb) 52.254 kg (115 lb 3.2 oz)    History of present illness:  As per Chelsea Robinson on 3/11: Chelsea Robinson is 70 year old female with Robinson past medical history significant for diabetes mellitus type 2 without complication, Alzheimer's dementia, hypertension; who was brought in by family who report that the patient is not eating the last 2 days. History is obtained from review of records as patient has dementia and is nonverbal. Per family the patient at baseline will eat with assistance and is usually nonverbal.Reported associated symptoms of patient being more lethargic and sleeping more. To the family's knowledge she had not been coughing or vomiting. At the facility she had been getting physical therapy 2 or 3 days Robinson week per the request of her neurologist. Also reported that patient was recently increased one of her medications to 20 mg.  Upon admission patient is evaluated and seen have elevation and BUN to creatinine ratio of 21:1.2 . Other workup including chest x-ray and urinalysis appear showed no acute signs of infection.  Hospital Course:   Adult failure to thrive -Likely simply represents  progression of patient's advanced Alzheimer's dementia. -Palliative care consultation has been requested, but family still not comfortable with full comfort care measures. -Per family report she is non-verbal at baseline.  Dementia -See no need to resume namenda at this point given how advanced her dementia is. -Doubt the increase in dose of namenda is the etiology of her FTT. -Will DC remeron as this may be Robinson cause of her hypersomnolence.  Acute on CKD Stage III -Cr is back at baseline.  HTN -Well controlled -Will take her off HCTZ given her FTT and propensity to dehydration.  Procedures:  None   Consultations:  Palliative Care Medicine  Discharge Instructions  Discharge Instructions    Increase activity slowly    Complete by:  As directed             Medication List    STOP taking these medications        hydrochlorothiazide 12.5 MG tablet  Commonly known as:  HYDRODIURIL     memantine 10 MG tablet  Commonly known as:  NAMENDA     mirtazapine 15 MG tablet  Commonly known as:  REMERON      TAKE these medications        aspirin 81 MG tablet  Take 81 mg by mouth daily.     atorvastatin 10 MG tablet  Commonly known as:  LIPITOR  Take 10 mg by mouth at bedtime.     cholecalciferol 1000 units tablet  Commonly known as:  VITAMIN D  Take 2,000 Units by mouth daily.     glimepiride 2 MG tablet  Commonly known  as:  AMARYL  Take 1 mg by mouth daily with breakfast.     loperamide 2 MG tablet  Commonly known as:  IMODIUM Robinson-D  Take 2 mg by mouth 4 (four) times daily as needed for diarrhea or loose stools.     LORazepam 0.5 MG tablet  Commonly known as:  ATIVAN  Take 0.5 mg by mouth at bedtime as needed for anxiety or sleep.     megestrol 40 MG/ML suspension  Commonly known as:  MEGACE  Take 400 mg by mouth 2 (two) times daily.     metFORMIN 500 MG tablet  Commonly known as:  GLUCOPHAGE  Take 500 mg by mouth 2 (two) times daily with Robinson meal.      polyethylene glycol packet  Commonly known as:  MIRALAX / GLYCOLAX  Take 17 g by mouth daily.       No Known Allergies     Follow-up Information    Follow up with Chelsea Robinson. Schedule an appointment as soon as possible for Robinson visit in 2 weeks.   Specialty:  Family Medicine   Contact information:   [address] [place] [place] [id number] [phone]        The results of significant diagnostics from this hospitalization (including imaging, microbiology, ancillary and laboratory) are listed below for reference.    Significant Diagnostic Studies: Dg Chest Port 1 View  02/09/2016  CLINICAL DATA:  Pre admission EXAM: PORTABLE CHEST 1 VIEW COMPARISON:  07/22/2015 FINDINGS: Heart is normal size. No confluent airspace opacities or effusions. No acute bony abnormality. IMPRESSION: No active disease. Electronically Signed   By: Chelsea NoseKevin  Robinson M.D.   On: 02/09/2016 19:25    Microbiology: Recent Results (from the past 240 hour(s))  Urine culture     Status: None   Collection Time: 02/09/16  4:20 PM  Result Value Ref Range Status   Specimen Description URINE, CATHETERIZED  Final   Special Requests Normal  Final   Culture   Final    NO GROWTH 2 DAYS [facility]    Report Status 02/11/2016 FINAL  Final     Labs: Basic Metabolic Panel:  Recent Labs Lab 02/09/16 1722 02/10/16 0620  NA 135 140  K 3.8 3.4*  CL 103 109  CO2 23 24  GLUCOSE 126* 106*  BUN 21* 15  CREATININE 1.20* 1.00  CALCIUM 9.2 9.1   Liver Function Tests: No results for input(s): AST, ALT, ALKPHOS, BILITOT, PROT, ALBUMIN in the last 168 hours. No results for input(s): LIPASE, AMYLASE in the last 168 hours.  Recent Labs Lab 02/09/16 1722  AMMONIA 12   CBC:  Recent Labs Lab 02/09/16 1722 02/10/16 0620  WBC 9.8 8.4  NEUTROABS 6.4  --   HGB 11.4* 10.7*  HCT 34.0* 32.1*  MCV 82.5 82.7  PLT 347 336   Cardiac Enzymes: No results for input(s): CKTOTAL,  CKMB, CKMBINDEX, TROPONINI in the last 168 hours. BNP: BNP (last 3 results) No results for input(s): BNP in the last 8760 hours.  ProBNP (last 3 results) No results for input(s): PROBNP in the last 8760 hours.  CBG:  Recent Labs Lab 02/11/16 2009 02/11/16 2330 02/12/16 0339 02/12/16 0726 02/12/16 1124  GLUCAP 150* 69 189* 97 162*       Signed:  HERNANDEZ ACOSTA,Shep Porter  [facility] Pager: [phone] 02/12/2016, 12:14 PM

## 2016-02-12 NOTE — Progress Notes (Signed)
Daily Progress Note   Patient Name: Chelsea Robinson       Date: 02/12/2016 DOB: 12/31/1945  Age: 70 y.o. MRN#: [id number] Attending Physician: Memory Argue* Primary Care Physician: Delorse Lek, MD Admit Date: 02/09/2016  Reason for Consultation/Follow-up: Disposition, Establishing goals of care and Psychosocial/spiritual support  Subjective: Chelsea Robinson is resting quietly in bed in a dark room.  Her eyes are closed, but she slowly opens her eyes as I call out her name. She does not try to communicate with me in any way. There is no family at bedside. Nursing staff shares that she did not eat her evening meal last night, and she declines to take her medication. It is reported that she ate 50% of her breakfast.  Per conversations with daughter Marchelle Folks yesterday she is expected to be returned to her assisted-living facility today. There is concern that she will not be able to continue to live in the assisted living facility, and that she will continue to experience decline and rehospitalization.  No call to daughter Marchelle Folks today as Chelsea Robinson is expected to return to her assisted-living facility today.  Length of Stay: 2 days  Current Medications: Scheduled Meds:  . aspirin  81 mg Oral Daily  . atorvastatin  10 mg Oral QHS  . cholecalciferol  2,000 Units Oral Daily  . enoxaparin (LOVENOX) injection  40 mg Subcutaneous Q24H  . insulin aspart  0-9 Units Subcutaneous 6 times per day  . megestrol  400 mg Oral BID    Continuous Infusions: . sodium chloride 75 mL/hr at 02/11/16 2329    PRN Meds: acetaminophen **OR** acetaminophen, levalbuterol, loperamide, LORazepam, ondansetron **OR** ondansetron (ZOFRAN) IV, polyethylene glycol  Physical Exam: Physical Exam  Constitutional: No  distress.  HENT:  Head: Normocephalic and atraumatic.  Cardiovascular: Normal rate and regular rhythm.   Pulmonary/Chest: Effort normal. No respiratory distress.  Abdominal: She exhibits no distension. There is no guarding.  Neurological:  Lethargic, will open eyes briefly.  Skin: Skin is warm and dry.  Nursing note and vitals reviewed.               Vital Signs: BP 145/73 mmHg  Pulse 79  Temp(Src) 98.1 F (36.7 C) (Oral)  Resp 18  Ht  (1.676 m)  Wt 52.254 kg (115 lb  3.2 oz)  BMI 18.60 kg/m2  SpO2 97% SpO2: SpO2: 97 % O2 Device: O2 Device: Not Delivered O2 Flow Rate:    Intake/output summary:  Intake/Output Summary (Last 24 hours) at 02/12/16 1508 Last data filed at 02/12/16 0900  Gross per 24 hour  Intake   1380 ml  Output      0 ml  Net   1380 ml   LBM:   Baseline Weight: Weight: 52.164 kg (115 lb) Most recent weight: Weight: 52.254 kg (115 lb 3.2 oz)       Palliative Assessment/Data: Flowsheet Rows        Most Recent Value   Intake Tab    Referral Department  Hospitalist   Unit at Time of Referral  Med/Surg Unit   Palliative Care Primary Diagnosis  Other (Comment) [FTT]   Date Notified  02/10/16   Palliative Care Type  New Palliative care   Reason for referral  Clarify Goals of Care   Date of Admission  02/09/16   Date first seen by Palliative Care  02/11/16   # of days Palliative referral response time  1 Day(s)   # of days IP prior to Palliative referral  1   Clinical Assessment    Palliative Performance Scale Score  20%   Pain Max last 24 hours  Not able to report   Pain Min Last 24 hours  Not able to report   Dyspnea Max Last 24 Hours  Not able to report   Dyspnea Min Last 24 hours  Not able to report   Psychosocial & Spiritual Assessment    Social Work Plan of Care  Staff support   Palliative Care Outcomes    Patient/Family meeting held?  Yes   Who was at the meeting?  daughter [name] via phone   Palliative Care Outcomes  Clarified goals of  care, Provided advance care planning, Provided psychosocial or spiritual support   Palliative Care follow-up planned  -- [follow up at [facility]]      Additional Data Reviewed: CBC    Component Value Date/Time   WBC 8.4 02/10/2016 0620   RBC 3.88 02/10/2016 0620   HGB 10.7* 02/10/2016 0620   HCT 32.1* 02/10/2016 0620   PLT 336 02/10/2016 0620   MCV 82.7 02/10/2016 0620   MCH 27.6 02/10/2016 0620   MCHC 33.3 02/10/2016 0620   RDW 14.0 02/10/2016 0620   LYMPHSABS 2.5 02/09/2016 1722   MONOABS 0.9 02/09/2016 1722   EOSABS 0.1 02/09/2016 1722   BASOSABS 0.0 02/09/2016 1722    CMP     Component Value Date/Time   NA 140 02/10/2016 0620   K 3.4* 02/10/2016 0620   CL 109 02/10/2016 0620   CO2 24 02/10/2016 0620   GLUCOSE 106* 02/10/2016 0620   BUN 15 02/10/2016 0620   CREATININE 1.00 02/10/2016 0620   CALCIUM 9.1 02/10/2016 0620   PROT 7.4 07/22/2015 1735   ALBUMIN 3.8 07/22/2015 1735   AST 17 07/22/2015 1735   ALT 20 07/22/2015 1735   ALKPHOS 50 07/22/2015 1735   BILITOT 1.0 07/22/2015 1735   GFRNONAA 55* 02/10/2016 0620   GFRAA >60 02/10/2016 0620       Problem List:  Patient Active Problem List   Diagnosis Date Noted  . Palliative care encounter   . Advanced care planning/counseling discussion   . Failure to thrive in adult 02/09/2016  . Dehydration 02/09/2016  . Chronic kidney disease, stage III (moderate) 02/09/2016  . Altered mental state 02/09/2016  .  Arterial hypotension   . Diarrhea 05/02/2015  . Acute encephalopathy 05/02/2015  . Hypokalemia 05/02/2015  . Diabetes mellitus without complication (HCC)   . Dementia   . Renal disorder   . Essential hypertension 01/28/2008  . INTERNAL HEMORRHOIDS 01/28/2008  . EXTERNAL HEMORRHOIDS 01/28/2008  . CONSTIPATION, CHRONIC 01/28/2008  . HEMOCCULT POSITIVE STOOL 01/28/2008  . OSTEOARTHRITIS 01/28/2008     Palliative Care Assessment & Plan    1.Code Status:  Full code    Code Status Orders         Start     Ordered   02/09/16 2017  Full code   Continuous     02/09/16 2019    Code Status History    Date Active Date Inactive Code Status Order ID Comments User Context   05/02/2015  3:59 PM 05/04/2015  8:26 PM Full Code [id number]  Gwenyth Bender, NP Inpatient       2. Goals of Care/Additional Recommendations:  Treat the treatable at this point  Limitations on Scope of Treatment: Treat the treatable.  Desire for further Chaplaincy support:no  Psycho-social Needs: Education on Hospice  3. Symptom Management:      1. Per hospitalist.  4. Palliative Prophylaxis:   Frequent Pain Assessment, Oral Care and Turn Reposition  5. Prognosis: Unable to determine, likely less than 6 months due to advanced dementia, failure to thrive, and new onset of decreased oral intake.  6. Discharge Planning:  Disposition to return to assisted-living facility at this point, daughter shares that she is searching for skilled nursing facility for long-term care.   Care plan was discussed with Nursing staff, case manager, social worker, and Dr. Viann Fish.  Thank you for allowing the Palliative Medicine Team to assist in the care of this patient.   Time In: 0940 Time Out: 1015 Total Time 35 minutes  Prolonged Time Billed  no         Katheran Awe, NP  02/12/2016, 3:08 PM  Please contact Palliative Medicine Team phone at [phone] for questions and concerns.

## 2016-03-01 DEATH — deceased

## 2016-08-31 IMAGING — DX DG ABDOMEN ACUTE W/ 1V CHEST
3 series · 3 of 3 positions shown · non-contrast
Comparison: None.

CLINICAL DATA: Pt brought in from SNF by [facility]. Facility noted
"several runny stools" since this morning and daughter reports that
pt hasn't been able to keep anything down. Daughter reports no
recent abx use. Last infection was UTI about 1mo prior.

EXAM:
DG ABDOMEN ACUTE W/ 1V CHEST

[abdomen erect]
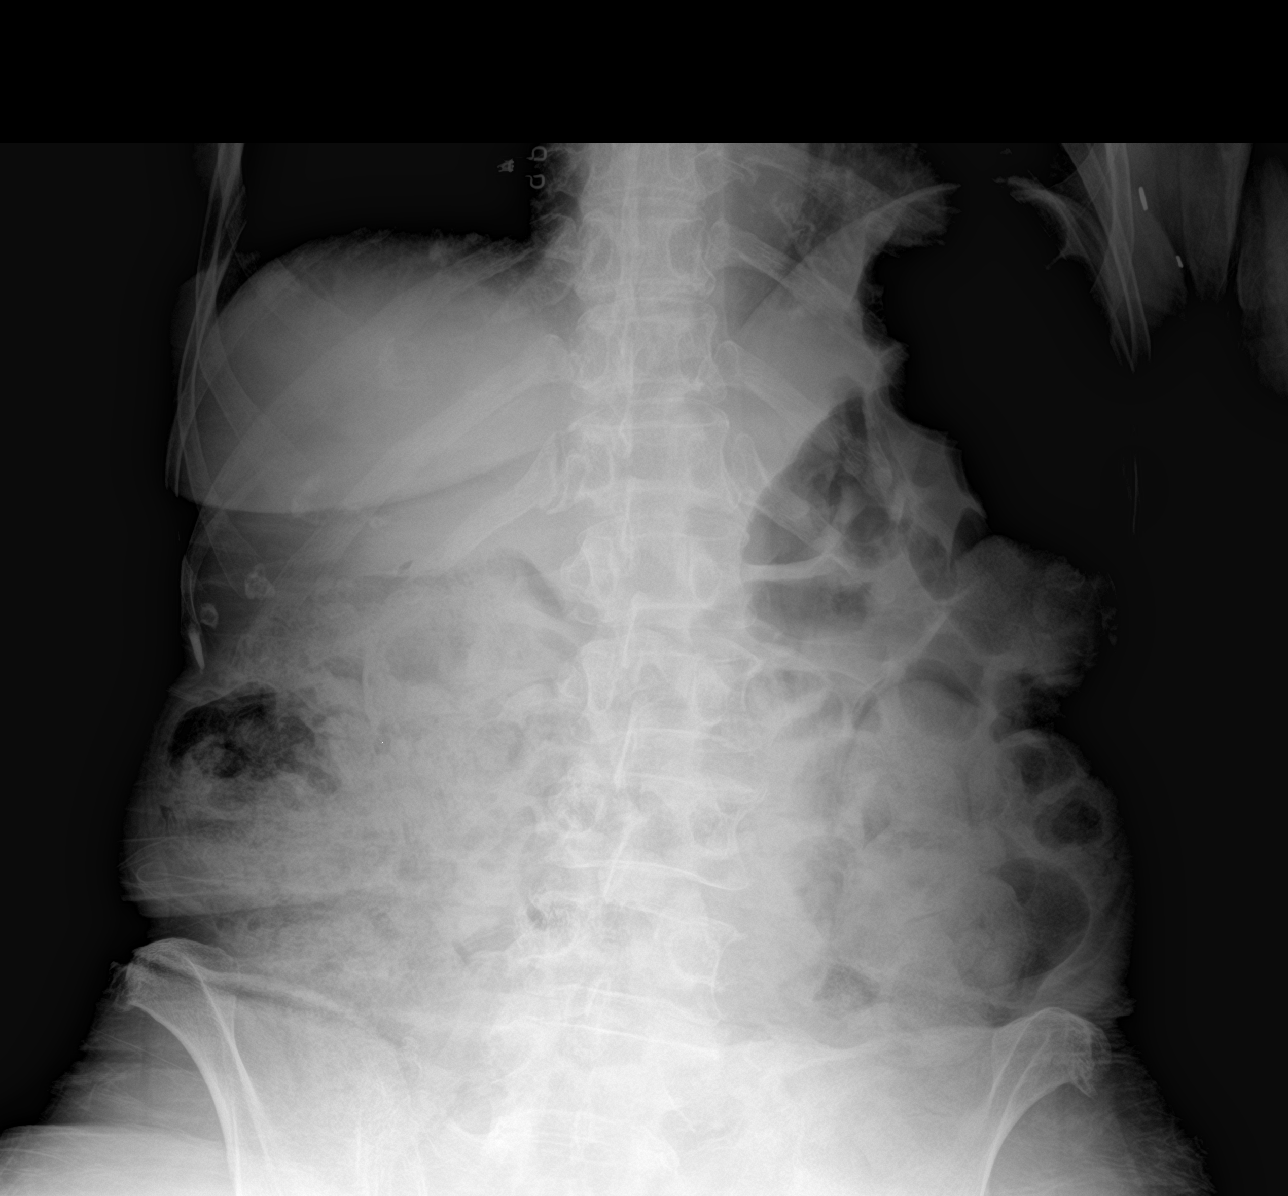

[abdomen supine]
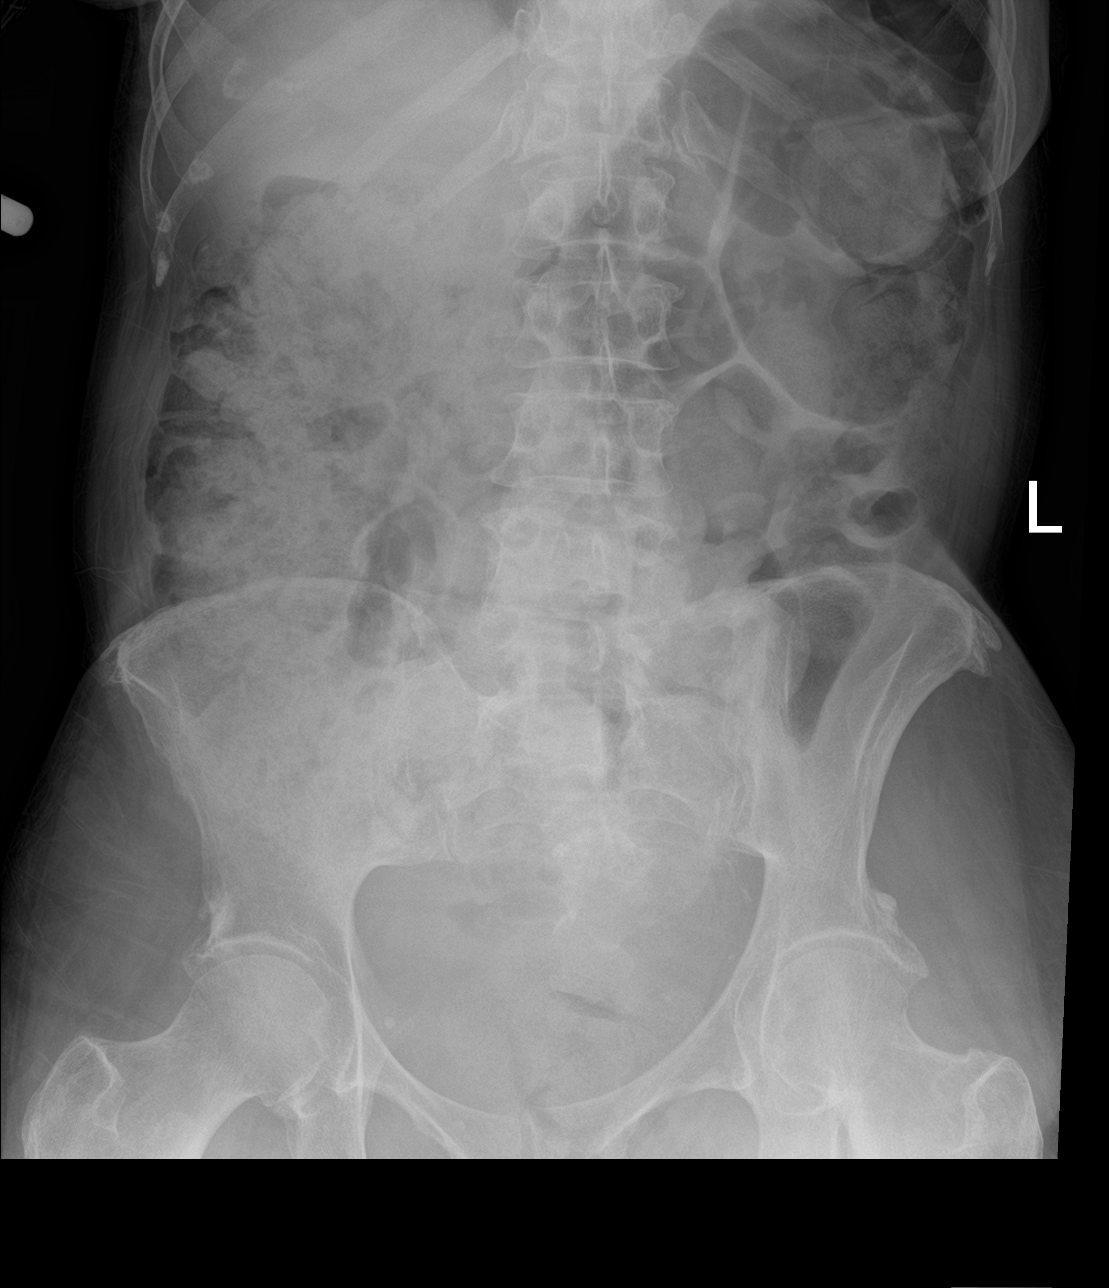

[chest ap]
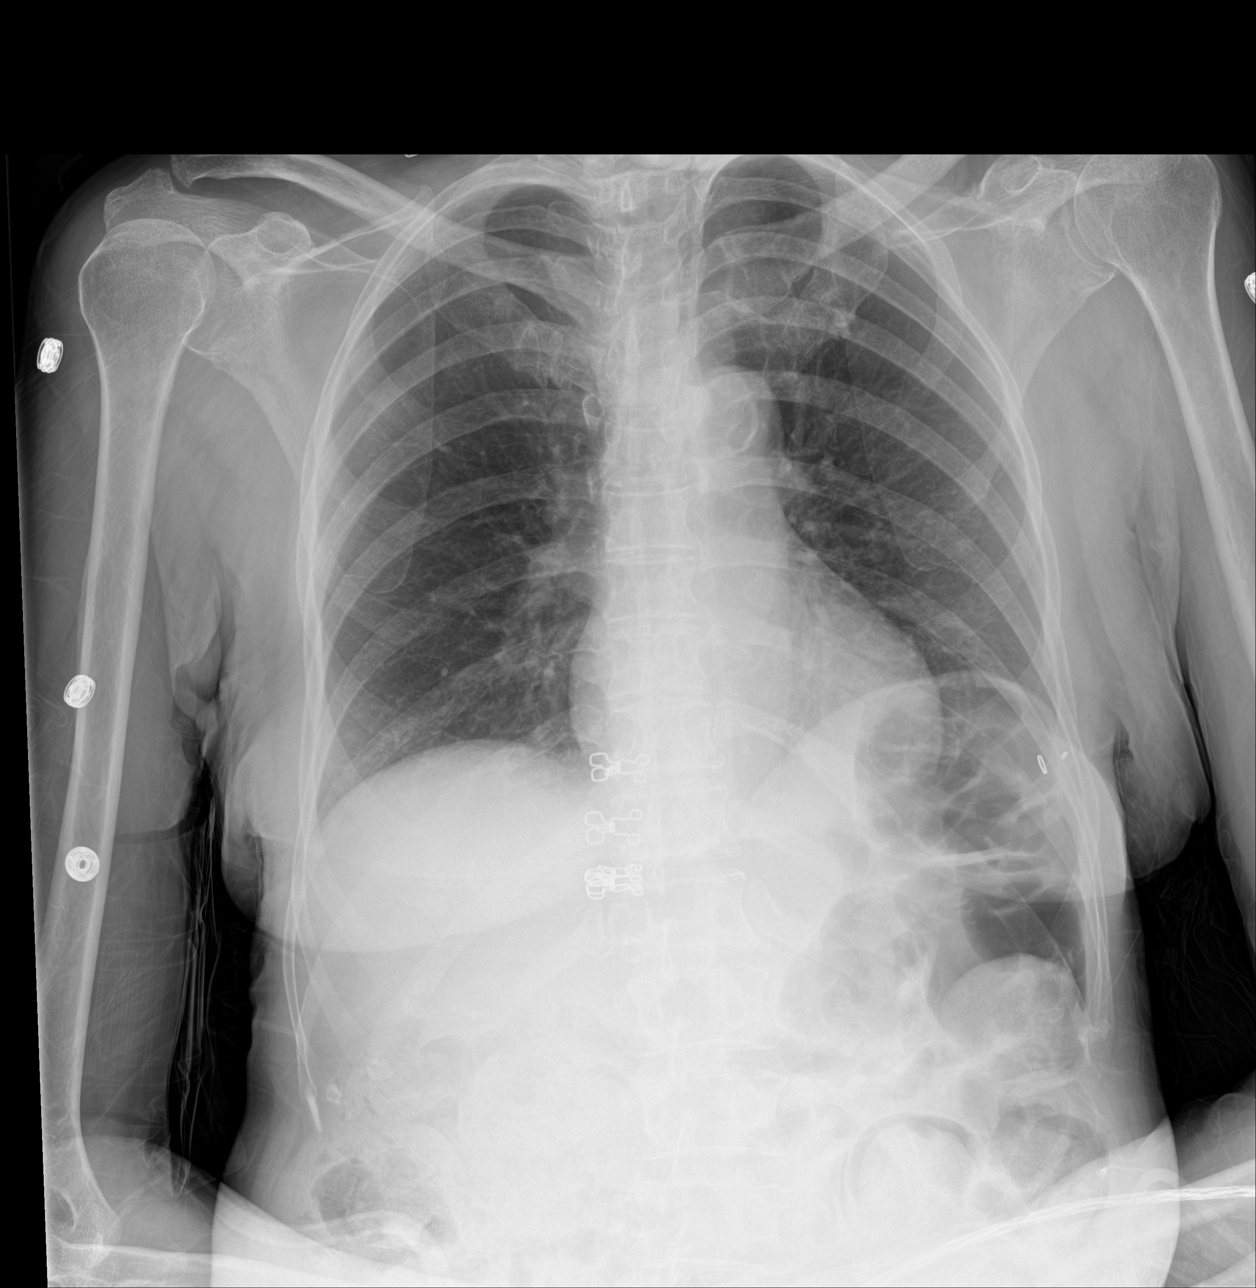

[3 of 3 positions shown; findings below may reference images not displayed]

FINDINGS: There is no evidence of bowel obstruction and no free air.

Moderate increased stool is noted in the colon, most evident in the
rectosigmoid and right colon.

There is no evidence of renal or ureteral stones. There are
scattered vascular calcifications in the pelvis.
IMPRESSION: 1. No acute findings.  No evidence of bowel obstruction or free air.
2. Moderate increased stool in the colon rectum.

## 2017-03-21 IMAGING — CR DG CHEST 1V PORT
1 series · 1 of 1 positions shown · non-contrast
Comparison: 07/22/2015

CLINICAL DATA: Pre admission

EXAM:
PORTABLE CHEST 1 VIEW

[ap portable]
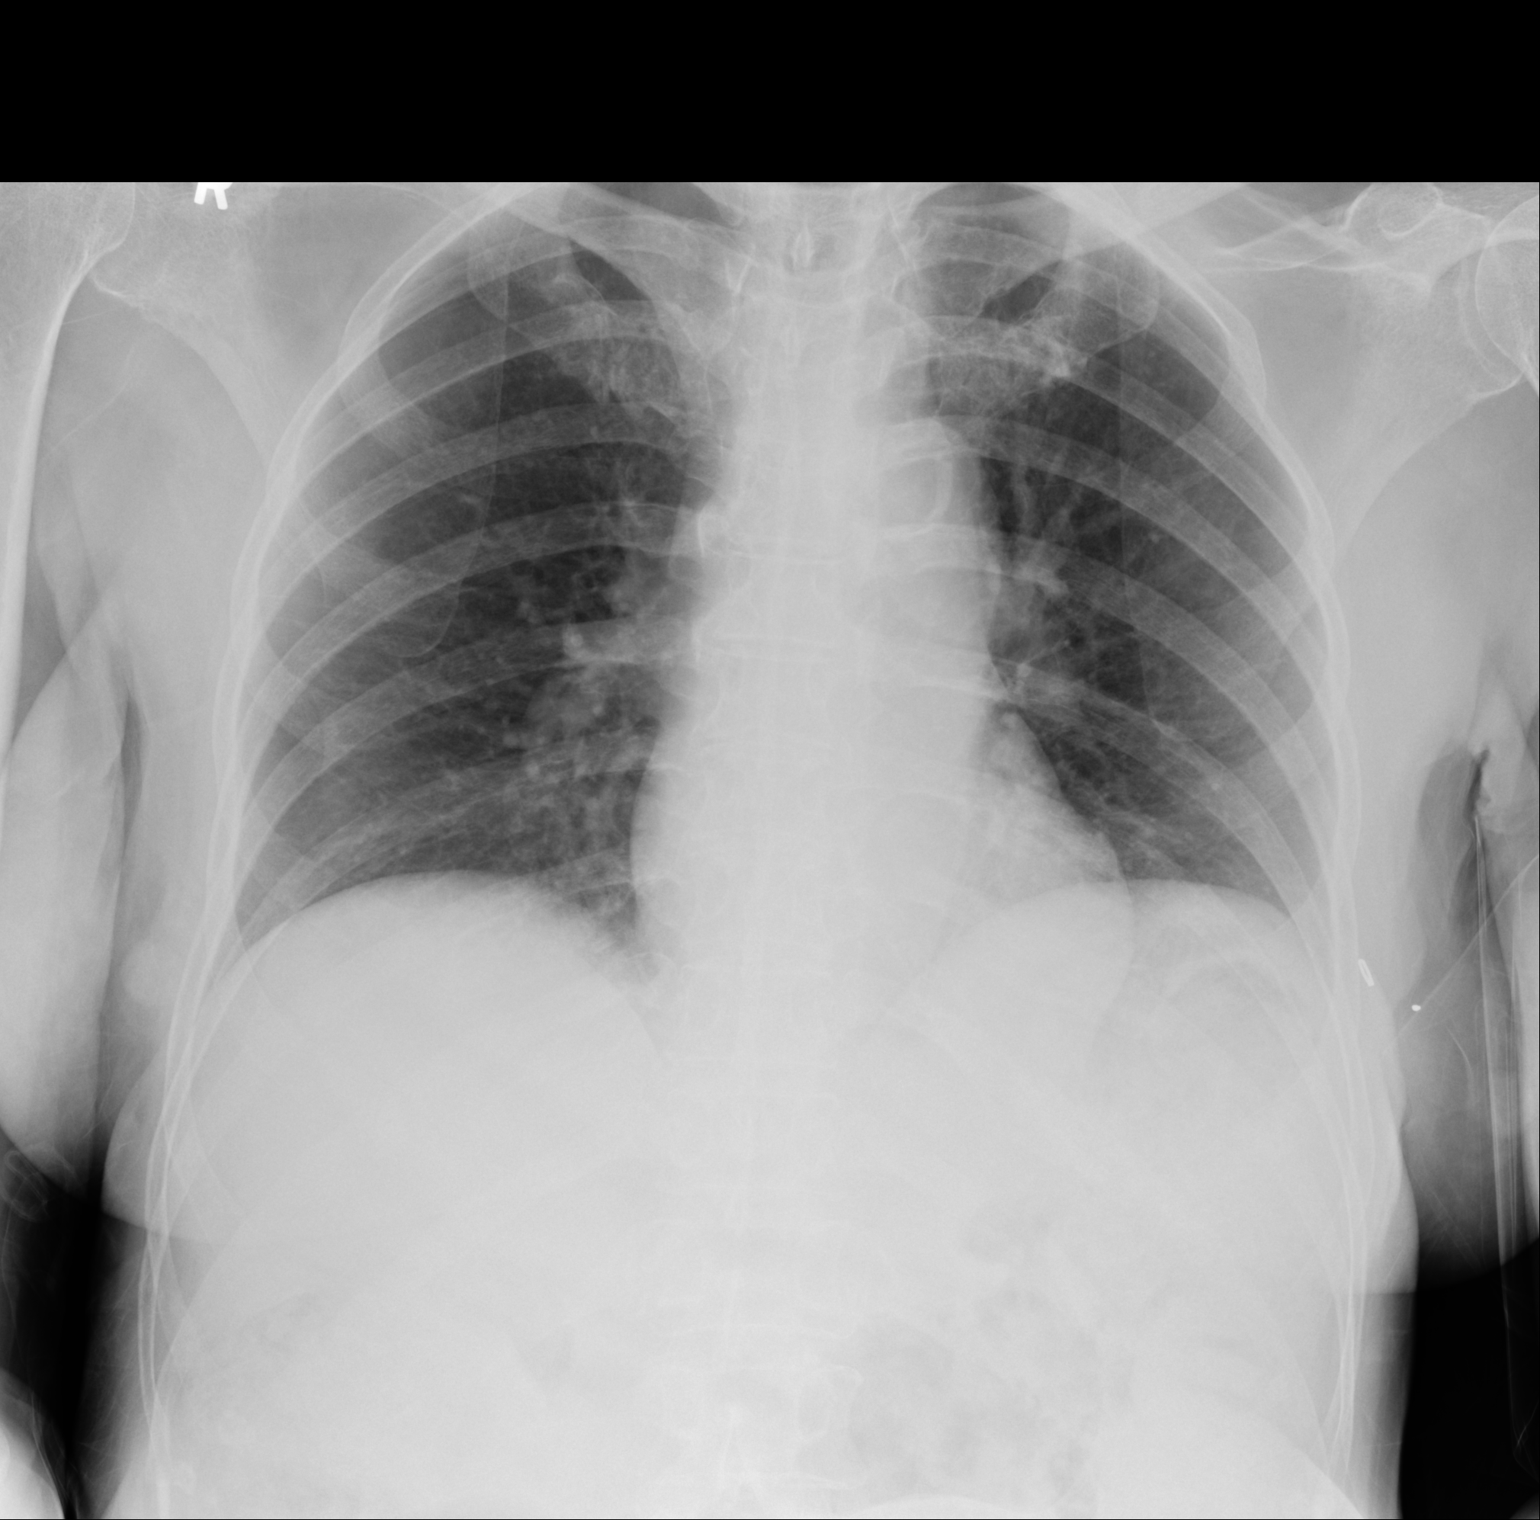

[1 of 1 positions shown; findings below may reference images not displayed]

FINDINGS: Heart is normal size. No confluent airspace opacities or effusions.
No acute bony abnormality.
IMPRESSION: No active disease.
